# Patient Record
Sex: Male | Born: 1980 | Race: White | Hispanic: No | Marital: Single | Smoking: Never smoker
Health system: Southern US, Community
[De-identification: ages and names within clinical notes are randomized; demographics above are authoritative.]

## PROBLEM LIST (undated history)

## (undated) HISTORY — PX: OTHER SURGICAL HISTORY: SHX169

---

## 2012-07-02 ENCOUNTER — Ambulatory Visit (INDEPENDENT_AMBULATORY_CARE_PROVIDER_SITE_OTHER): Payer: BC Managed Care – PPO | Admitting: Urology

## 2012-07-02 DIAGNOSIS — N500 Atrophy of testis: Secondary | ICD-10-CM

## 2012-07-02 DIAGNOSIS — N469 Male infertility, unspecified: Secondary | ICD-10-CM

## 2013-11-11 ENCOUNTER — Encounter: Payer: Self-pay | Admitting: *Deleted

## 2014-03-19 ENCOUNTER — Ambulatory Visit (INDEPENDENT_AMBULATORY_CARE_PROVIDER_SITE_OTHER): Payer: Self-pay | Admitting: Physician Assistant

## 2014-03-19 VITALS — BP 122/68 | HR 85 | Temp 98.6°F | Resp 17 | Ht 67.0 in | Wt 200.0 lb

## 2014-03-19 DIAGNOSIS — Z029 Encounter for administrative examinations, unspecified: Secondary | ICD-10-CM

## 2014-03-19 DIAGNOSIS — Z024 Encounter for examination for driving license: Secondary | ICD-10-CM

## 2014-03-19 NOTE — Progress Notes (Signed)
This patient presents for DOT examination for fitness for duty.  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  Medical History: no  Any illness or injury in the last 5 years? yes  Head/Brain Injuries, disorders or illnesses no  Seizures, epilepsy yes  Eye disorders or impaired vision (except corrective lenses) - wears glasses no  Ear disorders, loss of hearing or balance no Heart disease or heart attack; other cardiovascular condition no  Heart surgery (valve replacement/bypass, angioplasty, pacemaker) no  High blood pressure no  Muscular disease no  Shortness of breath no  Lung disease, emphysema, asthma, chronic bronchitis no  Kidney disease, dialysis no  Liver disease no  Digestive problems no  Diabetes or elevated blood sugar no  Nervious or psychiatric disorders, e.g., severe depression no  Loss of, or altered consciousness no  Fainting, dizziness no  Sleep disorders, pauses in breathing while asleep, daytime sleepiness, loud snoring no  Stroke or paralysis no  Missing or impaired hand, arm, foot, leg, finger, toe no  Spinal injury or disease no  Chronic low back pain no  Regular, frequent alcohol use no  Narcotic or habit forming drug use  Current Medications: Prior to Admission medications   Not on File    Primary Care Provider: No PCP Per Patient Specialists:   Medical Examiner's Comments on Health History:  Healthy - has a current cold  TESTING:   Visual Acuity Screening   Right eye Left eye Both eyes  Without correction:     With correction: 20/20 20/20 20/20   Comments: Peripheral Vision: Right eye 85 degrees. Left eye 85 degrees. The patient can distinguish the colors red, amber and green.   Monocular Vision: No.  Hearing Aid used for test: No. Hearing Aid required to to meet standard: No. Distance from individual at which forced whispered voice can first be heard:   RIGHT ear - feet; LEFT ear - feet - pt is not able to hear  the whisper test  If audiometer used, record hearing loss in decibels:  Audiometry unable to be preformed in the office -  BP 122/68 mmHg  Pulse 85  Temp(Src) 98.6 F (37 C) (Oral)  Resp 17  Ht 5\' 7"  (1.702 m)  Wt 200 lb (90.719 kg)  BMI 31.32 kg/m2  SpO2 97% Pulse rate is regular BP 122/68  Urine Specimen: Specific Gravity 1.020, Protein neg, Blood neg, Sugar neg  Other Testing:   PHYSICAL EXAMINATION:  1. No. General Appearance 2. No. Eyes   3. No. Ears     4. No. Mouth and Throat    5. No. Heart     6. No. Lungs and Chest, not including breast examination  7. No. Abdomen and Viscera   8. No. Vascular System    9. No. Genitourinary System   10. No. Extremities-Limb impaired.  11. No. Spine, other musculoskeletal  12. No. Neurological     Comments: Healthy - wears glasses  Certification Status: does not meet standards for 2 year certificate. Does not meet standards. Meets standards, but periodic monitoring required due to: hearing problems  Driver qualified only for: 3 months   Return to medical examiner's office for follow-up on before 3 months  Wearing corrective lenses: yes Wearing hearing aid: no Accompanied by no waiver/exemption Skill performance Evaluation (SPE) Certificate: no Driving within an exempt intracity zone: no Qualified by operation of 49 CFR 161.09391.64: yes  Certification expires 06/17/2013

## 2016-09-15 DIAGNOSIS — S81812A Laceration without foreign body, left lower leg, initial encounter: Secondary | ICD-10-CM | POA: Diagnosis not present

## 2018-05-07 DIAGNOSIS — R05 Cough: Secondary | ICD-10-CM | POA: Diagnosis not present

## 2018-05-07 DIAGNOSIS — J019 Acute sinusitis, unspecified: Secondary | ICD-10-CM | POA: Diagnosis not present

## 2018-05-07 DIAGNOSIS — Z6832 Body mass index (BMI) 32.0-32.9, adult: Secondary | ICD-10-CM | POA: Diagnosis not present

## 2018-09-12 ENCOUNTER — Other Ambulatory Visit: Payer: Self-pay

## 2018-09-12 ENCOUNTER — Emergency Department (HOSPITAL_COMMUNITY): Payer: BC Managed Care – PPO

## 2018-09-12 ENCOUNTER — Emergency Department (HOSPITAL_COMMUNITY)
Admission: EM | Admit: 2018-09-12 | Discharge: 2018-09-12 | Disposition: A | Discharge status: Home / Self Care | Payer: BC Managed Care – PPO | Attending: Emergency Medicine | Admitting: Emergency Medicine

## 2018-09-12 ENCOUNTER — Encounter (HOSPITAL_COMMUNITY): Payer: Self-pay | Admitting: Emergency Medicine

## 2018-09-12 DIAGNOSIS — Y9389 Activity, other specified: Secondary | ICD-10-CM | POA: Diagnosis not present

## 2018-09-12 DIAGNOSIS — R079 Chest pain, unspecified: Secondary | ICD-10-CM | POA: Diagnosis not present

## 2018-09-12 DIAGNOSIS — R51 Headache: Secondary | ICD-10-CM | POA: Insufficient documentation

## 2018-09-12 DIAGNOSIS — Y999 Unspecified external cause status: Secondary | ICD-10-CM | POA: Insufficient documentation

## 2018-09-12 DIAGNOSIS — S50312A Abrasion of left elbow, initial encounter: Secondary | ICD-10-CM | POA: Diagnosis not present

## 2018-09-12 DIAGNOSIS — Y92410 Unspecified street and highway as the place of occurrence of the external cause: Secondary | ICD-10-CM | POA: Diagnosis not present

## 2018-09-12 DIAGNOSIS — R109 Unspecified abdominal pain: Secondary | ICD-10-CM | POA: Diagnosis not present

## 2018-09-12 DIAGNOSIS — S0181XA Laceration without foreign body of other part of head, initial encounter: Secondary | ICD-10-CM | POA: Insufficient documentation

## 2018-09-12 DIAGNOSIS — S80211A Abrasion, right knee, initial encounter: Secondary | ICD-10-CM | POA: Insufficient documentation

## 2018-09-12 LAB — COMPREHENSIVE METABOLIC PANEL
ALT: 22 U/L (ref 0–44)
AST: 22 U/L (ref 15–41)
Albumin: 4.3 g/dL (ref 3.5–5.0)
Alkaline Phosphatase: 51 U/L (ref 38–126)
Anion gap: 11 (ref 5–15)
BUN: 23 mg/dL — ABNORMAL HIGH (ref 6–20)
CO2: 24 mmol/L (ref 22–32)
Calcium: 9.3 mg/dL (ref 8.9–10.3)
Chloride: 102 mmol/L (ref 98–111)
Creatinine, Ser: 1.04 mg/dL (ref 0.61–1.24)
GFR calc Af Amer: >60 mL/min (ref >60)
GFR calc non Af Amer: >60 mL/min (ref >60)
Glucose, Bld: 138 mg/dL — ABNORMAL HIGH (ref 70–99)
Potassium: 3.5 mmol/L (ref 3.5–5.1)
Sodium: 137 mmol/L (ref 135–145)
Total Bilirubin: 1.1 mg/dL (ref 0.3–1.2)
Total Protein: 7.2 g/dL (ref 6.5–8.1)

## 2018-09-12 LAB — CBC WITH DIFFERENTIAL/PLATELET
Abs Immature Granulocytes: 0.04 10*3/uL (ref 0.00–0.07)
Basophils Absolute: 0.1 10*3/uL (ref 0.0–0.1)
Basophils Relative: 0 %
Eosinophils Absolute: 0 10*3/uL (ref 0.0–0.5)
Eosinophils Relative: 0 %
HCT: 48.3 % (ref 39.0–52.0)
Hemoglobin: 15.8 g/dL (ref 13.0–17.0)
Immature Granulocytes: 0 %
Lymphocytes Relative: 7 %
Lymphs Abs: 0.8 10*3/uL (ref 0.7–4.0)
MCH: 28.7 pg (ref 26.0–34.0)
MCHC: 32.7 g/dL (ref 30.0–36.0)
MCV: 87.7 fL (ref 80.0–100.0)
Monocytes Absolute: 1.1 10*3/uL — ABNORMAL HIGH (ref 0.1–1.0)
Monocytes Relative: 9 %
Neutro Abs: 9.6 10*3/uL — ABNORMAL HIGH (ref 1.7–7.7)
Neutrophils Relative %: 84 %
Platelets: 221 10*3/uL (ref 150–400)
RBC: 5.51 MIL/uL (ref 4.22–5.81)
RDW: 13.4 % (ref 11.5–15.5)
WBC: 11.6 10*3/uL — ABNORMAL HIGH (ref 4.0–10.5)
nRBC: 0 % (ref 0.0–0.2)

## 2018-09-12 LAB — ETHANOL: Alcohol, Ethyl (B): <10 mg/dL (ref <10)
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
## 2018-09-12 MED ORDER — HYDROMORPHONE HCL 1 MG/ML IJ SOLN
0.5000 mg | Freq: Once | INTRAMUSCULAR | Status: AC
  Administered 2018-09-12: 0.5 mg via INTRAVENOUS
  Filled 2018-09-12: qty 1

## 2018-09-12 MED ORDER — IBUPROFEN 800 MG PO TABS: 800.0000 mg | ORAL_TABLET | Freq: Three times a day (TID) | ORAL | 0 refills | Status: DC

## 2018-09-12 MED ORDER — IOHEXOL 300 MG/ML  SOLN
100.0000 mL | Freq: Once | INTRAMUSCULAR | Status: AC | PRN
  Administered 2018-09-12: 100 mL via INTRAVENOUS

## 2018-09-12 MED ORDER — LIDOCAINE HCL (PF) 1 % IJ SOLN
INTRAMUSCULAR | Status: AC
  Filled 2018-09-12: qty 10

## 2018-09-12 MED ORDER — SODIUM CHLORIDE 0.9 % IV BOLUS
1000.0000 mL | Freq: Once | INTRAVENOUS | Status: AC
  Administered 2018-09-12: 1000 mL via INTRAVENOUS

## 2018-09-12 MED ORDER — LIDOCAINE HCL (PF) 1 % IJ SOLN
10.0000 mL | Freq: Once | INTRAMUSCULAR | Status: AC
  Administered 2018-09-12: 10 mL via INTRADERMAL

## 2018-09-12 MED ORDER — BACITRACIN ZINC 500 UNIT/GM EX OINT
TOPICAL_OINTMENT | CUTANEOUS | Status: AC
  Administered 2018-09-12: 12:00:00
  Filled 2018-09-12: qty 9

## 2018-09-12 MED ORDER — OXYCODONE-ACETAMINOPHEN 5-325 MG PO TABS: 1.0000 | ORAL_TABLET | ORAL | 0 refills | Status: DC | PRN

## 2018-09-12 MED ORDER — POVIDONE-IODINE 10 % EX SOLN
CUTANEOUS | Status: AC
  Administered 2018-09-12: 11:00:00
  Filled 2018-09-12: qty 15

## 2018-09-12 NOTE — ED Notes (Signed)
Cleansed wounds with sur clens. Placed bacitracin on abrasions and covered with telfa and medipore tape. Xeroform placed on RT knee. RT knee wrapped with ace bandage for additional support. Pt given crutches.
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
## 2018-09-12 NOTE — Discharge Instructions (Addendum)
Keep the wounds clean and bandaged.  You may apply ice packs on/off to help reduce swelling.  Crutches as needed.  Sutures out in 5-7 days.  Follow-up with your primary doctor for recheck.  Return here for any signs of infection.

## 2019-03-26 ENCOUNTER — Encounter: Payer: Self-pay | Admitting: Gastroenterology

## 2019-04-14 ENCOUNTER — Other Ambulatory Visit: Payer: Self-pay

## 2019-04-14 ENCOUNTER — Ambulatory Visit (INDEPENDENT_AMBULATORY_CARE_PROVIDER_SITE_OTHER): Payer: Self-pay | Admitting: *Deleted

## 2019-04-14 DIAGNOSIS — Z1211 Encounter for screening for malignant neoplasm of colon: Secondary | ICD-10-CM

## 2019-04-14 MED ORDER — PEG 3350-KCL-NA BICARB-NACL 420 G PO SOLR: 4000.0000 mL | Freq: Once | ORAL | 0 refills | Status: AC

## 2019-04-14 NOTE — Patient Instructions (Signed)
Randall Butler   1981/03/23 MRN: [id number]    Procedure Date: 07/28/2019 Time to register: 9:30 am Place to register: [facility] Procedure Time: 10:30 am Scheduled provider: Dr. Oneida Alar  PREPARATION FOR COLONOSCOPY WITH TRI-LYTE SPLIT PREP  Please notify us immediately if you are diabetic, take iron supplements, or if you are on Coumadin or any other blood thinners.   Please hold the following medications: n/a  You will need to purchase 1 fleet enema and 1 box of Bisacodyl '5mg'$  tablets.   1 DAY BEFORE PROCEDURE:  DATE: 07/27/2019  DAY: Sunday Continue clear liquids the entire day - NO SOLID FOOD.   Diabetic medications adjustments for today: n/a  At 2:00 pm:  Take 2 Bisacodyl tablets.   At 4:00pm:  Start drinking your solution. Make sure you mix well per instructions on the bottle. Try to drink 1 (one) 8 ounce glass every 10-15 minutes until you have consumed HALF the jug. You should complete by 6:00pm.You must keep the left over solution refrigerated until completed next day.  Continue clear liquids. You must drink plenty of clear liquids to prevent dehyration and kidney failure.     DAY OF PROCEDURE:   DATE: 07/28/2019   DAY: Monday If you take medications for your heart, blood pressure or breathing, you may take these medications.  Diabetic medications adjustments for today: n/a  Five hours before your procedure time @  5:30 am:  Finish remaining amout of bowel prep, drinking 1 (one) 8 ounce glass every 10-15 minutes until complete. You have two hours to consume remaining prep.   Three hours before your procedure time @ 7:30 am:  Nothing by mouth.   At least one hour before going to the hospital:  Give yourself one Fleet enema. You may take your morning medications with sip of water unless we have instructed otherwise.      Please see below for Dietary Information.  CLEAR LIQUIDS INCLUDE:  Water Jello (NOT red in color)   Ice Popsicles (NOT red in color)    Tea (sugar ok, no milk/cream) Powdered fruit flavored drinks  Coffee (sugar ok, no milk/cream) Gatorade/ Lemonade/ Kool-Aid  (NOT red in color)   Juice: apple, white grape, white cranberry Soft drinks  Clear bullion, consomme, broth (fat free beef/chicken/vegetable)  Carbonated beverages (any kind)  Strained chicken noodle soup Hard Candy   Remember: Clear liquids are liquids that will allow you to see your fingers on the other side of a clear glass. Be sure liquids are NOT red in color, and not cloudy, but CLEAR.  DO NOT EAT OR DRINK ANY OF THE FOLLOWING:  Dairy products of any kind   Cranberry juice Tomato juice / V8 juice   Grapefruit juice Orange juice     Red grape juice  Do not eat any solid foods, including such foods as: cereal, oatmeal, yogurt, fruits, vegetables, creamed soups, eggs, bread, crackers, pureed foods in a blender, etc.   HELPFUL HINTS FOR DRINKING PREP SOLUTION:   Make sure prep is extremely cold. Mix and refrigerate the the morning of the prep. You may also put in the freezer.   You may try mixing some Crystal Light or Country Time Lemonade if you prefer. Mix in small amounts; add more if necessary.  Try drinking through a straw  Rinse mouth with water or a mouthwash between glasses, to remove after-taste.  Try sipping on a cold beverage /ice/ popsicles between glasses of prep.  Place a piece of sugar-free  hard candy in mouth between glasses.  If you become nauseated, try consuming smaller amounts, or stretch out the time between glasses. Stop for 30-60 minutes, then slowly start back drinking.        OTHER INSTRUCTIONS  You will need a responsible adult at least 40 years of age to accompany you and drive you home. This person must remain in the waiting room during your procedure. The hospital will cancel your procedure if you do not have a responsible adult with you.   1. Wear loose fitting clothing that is easily removed. 2. Leave jewelry and  other valuables at home.  3. Remove all body piercing jewelry and leave at home. 4. Total time from sign-in until discharge is approximately 2-3 hours. 5. You should go home directly after your procedure and rest. You can resume normal activities the day after your procedure. 6. The day of your procedure you should not:  Drive  Make legal decisions  Operate machinery  Drink alcohol  Return to work   You may call the office (Dept: [phone]) before 5:00pm, or page the doctor on call [phone]) after 5:00pm, for further instructions, if necessary.   Insurance Information YOU WILL NEED TO CHECK WITH YOUR INSURANCE COMPANY FOR THE BENEFITS OF COVERAGE YOU HAVE FOR THIS PROCEDURE.  UNFORTUNATELY, NOT ALL INSURANCE COMPANIES HAVE BENEFITS TO COVER ALL OR PART OF THESE TYPES OF PROCEDURES.  IT IS YOUR RESPONSIBILITY TO CHECK YOUR BENEFITS, HOWEVER, WE WILL BE GLAD TO ASSIST YOU WITH ANY CODES YOUR INSURANCE COMPANY MAY NEED.    PLEASE NOTE THAT MOST INSURANCE COMPANIES WILL NOT COVER A SCREENING COLONOSCOPY FOR PEOPLE UNDER THE AGE OF 50  IF YOU HAVE BCBS INSURANCE, YOU MAY HAVE BENEFITS FOR A SCREENING COLONOSCOPY BUT IF POLYPS ARE FOUND THE DIAGNOSIS WILL CHANGE AND THEN YOU MAY HAVE A DEDUCTIBLE THAT WILL NEED TO BE MET. SO PLEASE MAKE SURE YOU CHECK YOUR BENEFITS FOR A SCREENING COLONOSCOPY AS WELL AS A DIAGNOSTIC COLONOSCOPY.

## 2019-04-14 NOTE — Progress Notes (Addendum)
Gastroenterology Pre-Procedure Review  Request Date: 04/14/2019 Requesting Physician: Lianne Moris, PA-C @ [facility], no previous TCS, family hx tubular adenomas  PATIENT REVIEW QUESTIONS: The patient responded to the following health history questions as indicated:    1. Diabetes Melitis: no 2. Joint replacements in the past 12 months: no 3. Major health problems in the past 3 months: no 4. Has an artificial valve or MVP: no 5. Has a defibrillator: no 6. Has been advised in past to take antibiotics in advance of a procedure like teeth cleaning: no 7. Family history of colon cancer: no  8. Alcohol Use: yes, a couple beers a month 9. Illicit drug Use: no 10. History of sleep apnea: no 11. History of coronary artery or other vascular stents placed within the last 12 months: no 12. History of any prior anesthesia complications: no 13. There is no height or weight on file to calculate BMI.ht: 5'6 wt: 193 lbs    MEDICATIONS & ALLERGIES:    Patient reports the following regarding taking any blood thinners:   Plavix? no Aspirin? no Coumadin? no Brilinta? no Xarelto? no Eliquis? no Pradaxa? no Savaysa? no Effient? no  Patient confirms/reports the following medications:  No current outpatient medications on file.   No current facility-administered medications for this visit.    Patient confirms/reports the following allergies:  Allergies  Allergen Reactions  . Penicillins Other (See Comments)    Per pt. His mother has always told him that he was allergic to PCN , but he is not sure of what type of reaction he had     No orders of the defined types were placed in this encounter.   AUTHORIZATION INFORMATION Primary Insurance: [place],  [place] #: [id number],  Group #: [id number] Pre-Cert / Berkley Harvey required: No, not required  SCHEDULE INFORMATION: Procedure has been scheduled as follows:  Date: 07/28/2019, Time: 10:30 Location: [facility] with Dr. Darrick Penna  This Gastroenterology  Pre-Precedure Review Form is being routed to the following provider(s): Wynne Dust, NP

## 2019-04-15 ENCOUNTER — Telehealth: Payer: Self-pay | Admitting: *Deleted

## 2019-04-15 NOTE — Addendum Note (Signed)
Addended by: Noreene Larsson on: 04/15/2019 09:30 AM   Modules accepted: Orders, SmartSet

## 2019-04-15 NOTE — Telephone Encounter (Signed)
Noted  

## 2019-04-15 NOTE — Telephone Encounter (Signed)
[employer] and spoke to rep Ronalee Belts).  Informed him that pt is 39 yrs old and has a family hx of tubular adenomas.  He was recommended by [facility] to have his TCS at 39 yrs old.  Ronalee Belts said that no Josem Kaufmann would be required and pt has a $3500 in network deductible of which $450 has been met.  He also said that pt has a 20% coinsurance of $6350.00. Ref# [id number]. Called pt and gave him this information.  Pt decided to cancel his procedure and Covid screening due to the expenses that would be required of him.  [facility] to notify them.

## 2019-04-15 NOTE — Progress Notes (Signed)
Ok to schedule.

## 2019-07-25 ENCOUNTER — Other Ambulatory Visit (HOSPITAL_COMMUNITY): Payer: BC Managed Care – PPO

## 2019-07-28 ENCOUNTER — Encounter (HOSPITAL_COMMUNITY): Payer: Self-pay

## 2019-07-28 ENCOUNTER — Ambulatory Visit (HOSPITAL_COMMUNITY): Admit: 2019-07-28 | Payer: BC Managed Care – PPO | Admitting: Gastroenterology

## 2019-07-28 SURGERY — COLONOSCOPY
Anesthesia: Moderate Sedation

## 2020-03-03 ENCOUNTER — Other Ambulatory Visit: Payer: Self-pay | Admitting: Physician Assistant

## 2020-03-03 DIAGNOSIS — G959 Disease of spinal cord, unspecified: Secondary | ICD-10-CM

## 2020-03-09 ENCOUNTER — Other Ambulatory Visit: Payer: Self-pay

## 2020-03-09 ENCOUNTER — Ambulatory Visit
Admission: RE | Admit: 2020-03-09 | Discharge: 2020-03-09 | Disposition: A | Discharge status: Home / Self Care | Payer: BC Managed Care – PPO | Source: Ambulatory Visit | Attending: Physician Assistant | Admitting: Physician Assistant

## 2020-03-09 DIAGNOSIS — G959 Disease of spinal cord, unspecified: Secondary | ICD-10-CM

## 2020-03-09 IMAGING — MR MR LUMBAR SPINE W/O CM
4 of 5 series · 27 of 48 positions shown · non-contrast
Comparison: None.

CLINICAL DATA: Numbness.  Low back pain.

EXAM:
MRI LUMBAR SPINE WITHOUT CONTRAST
TECHNIQUE: Multiplanar, multisequence MR imaging of the lumbar spine was
performed. No intravenous contrast was administered.

[Series 3: T2 · sagittal · 4.0mm · 1.09mm/px · 6 of 15 slices shown (1 of 2)]
[im 1/15]
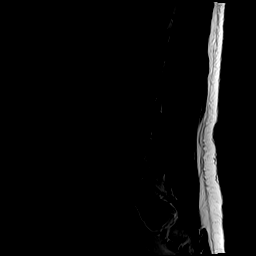
[im 3/15]
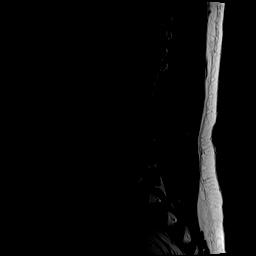
[im 6/15]
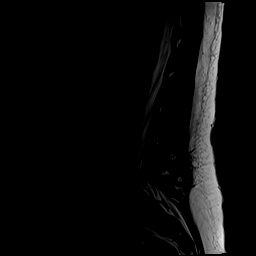
[im 9/15]
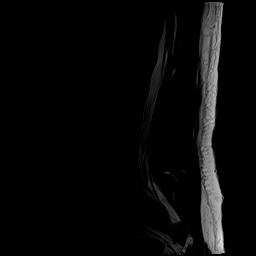
[im 12/15]
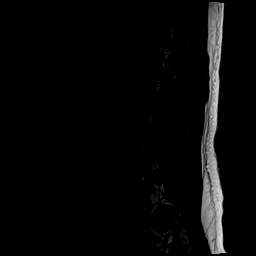
[im 15/15]
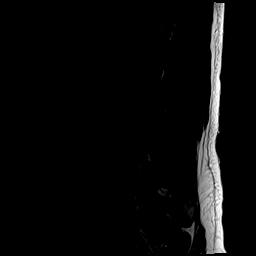

[Series 5: T1 · sagittal · 4.0mm · 1.09mm/px · 6 of 15 slices shown (1 of 2)]
[im 1/15]
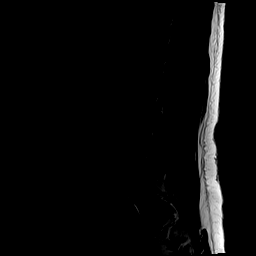
[im 3/15]
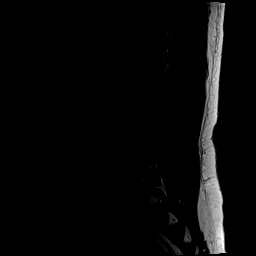
[im 6/15]
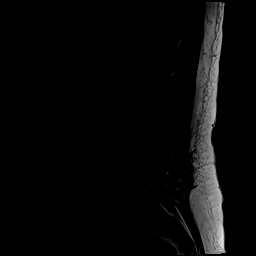
[im 9/15]
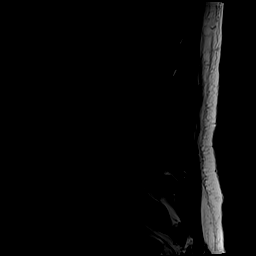
[im 12/15]
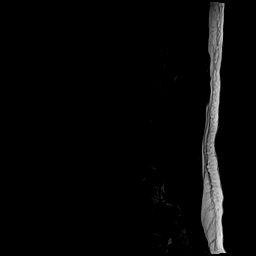
[im 15/15]
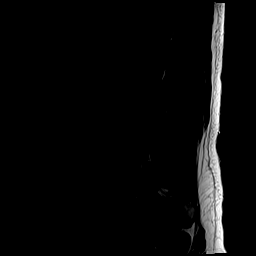

[Series 6: T2 · axial · 4.0mm · 0.39mm/px · z∈[-169,+60]mm · 9 of 40 slices shown (2 of 2)]
[im 1/40]
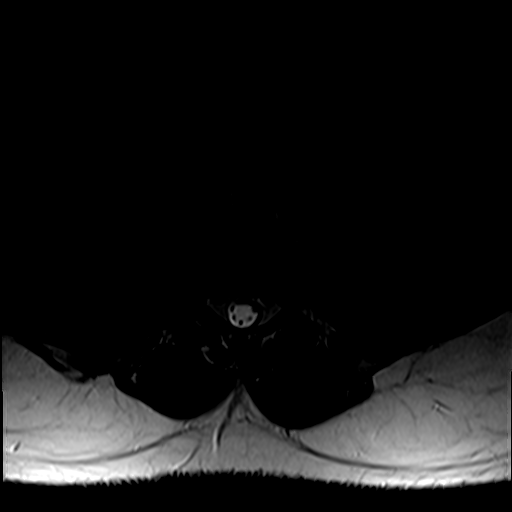
[im 6/40]
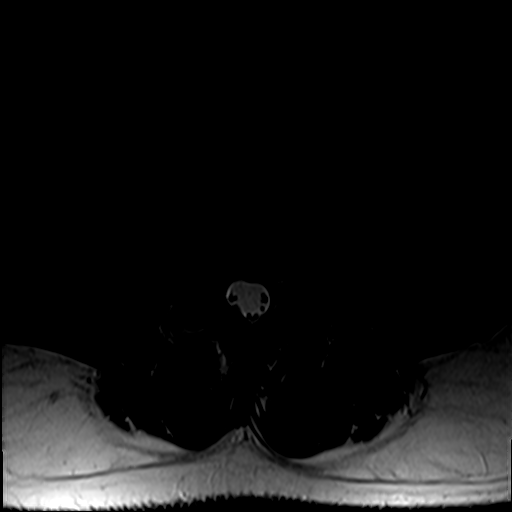
[im 12/40]
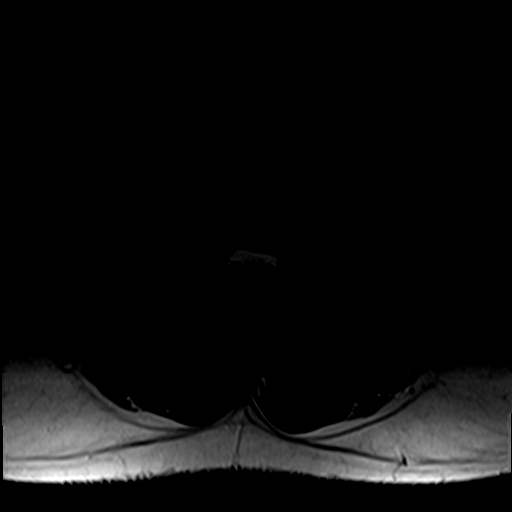
[im 17/40]
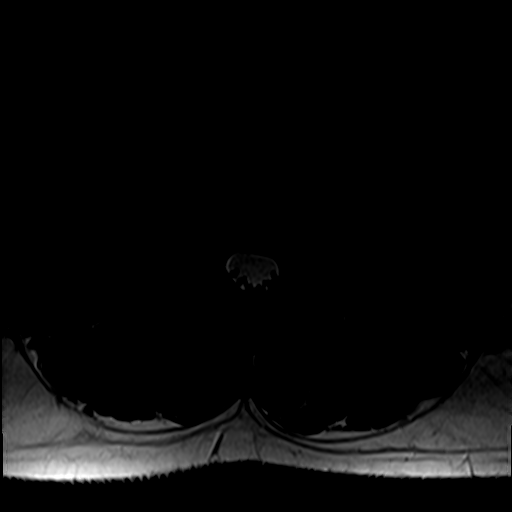
[im 20/40]
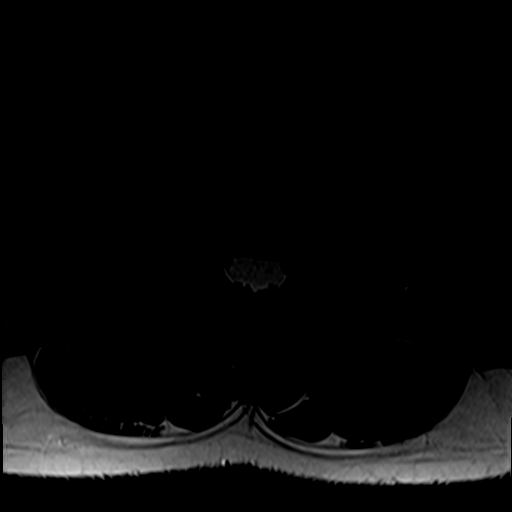
[im 23/40]
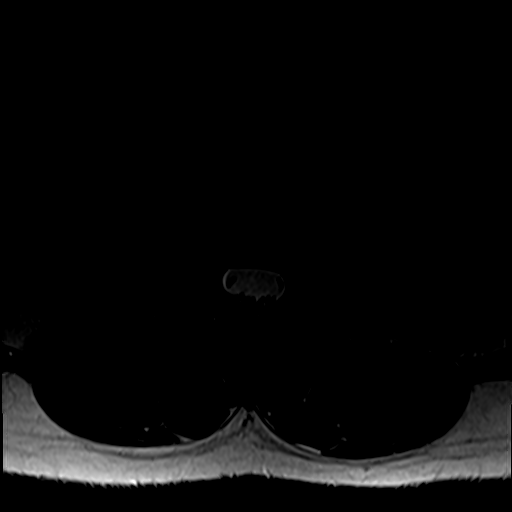
[im 28/40]
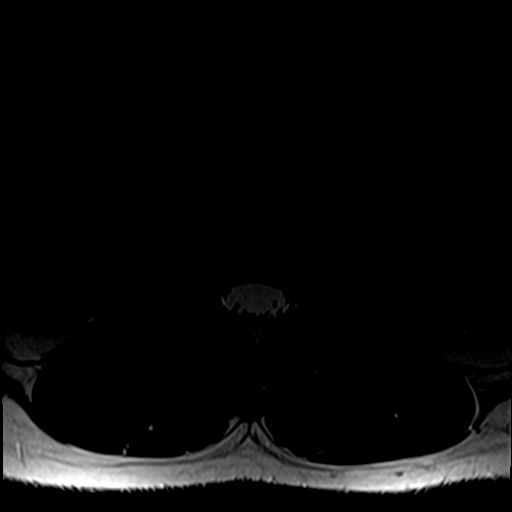
[im 34/40]
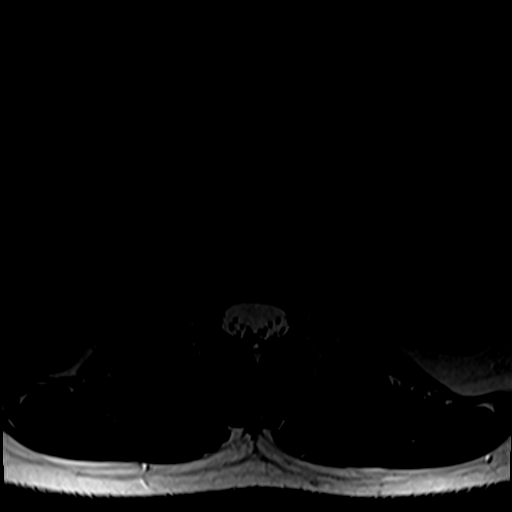
[im 40/40]
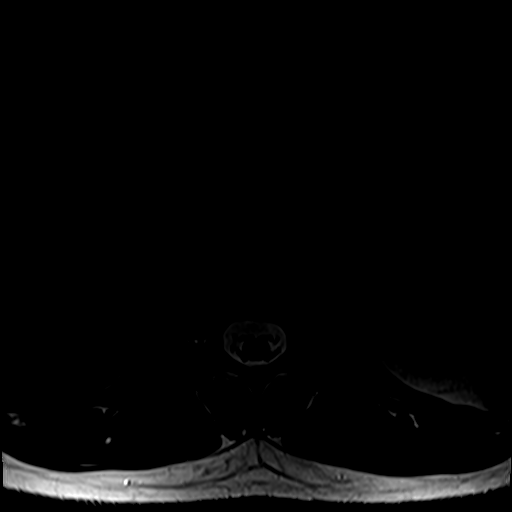

[Series 7: T1 · axial · 4.0mm · 0.39mm/px · z∈[-169,+31]mm · 6 of 40 slices shown (2 of 2)]
[im 1/40]
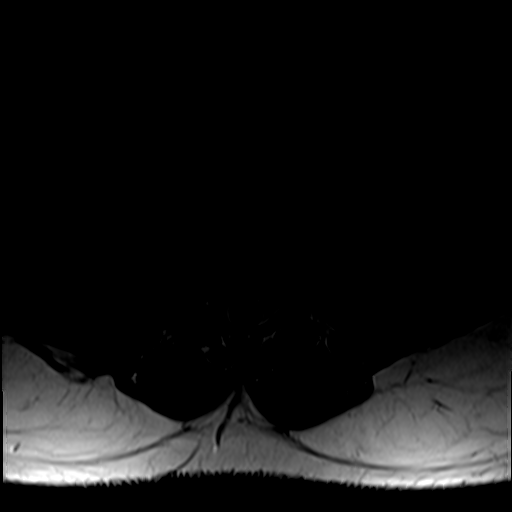
[im 6/40]
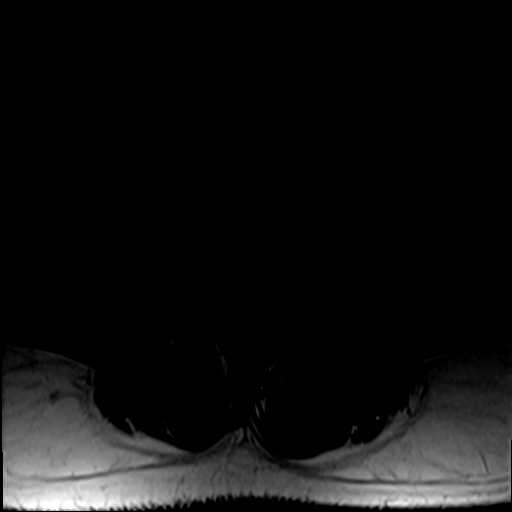
[im 12/40]
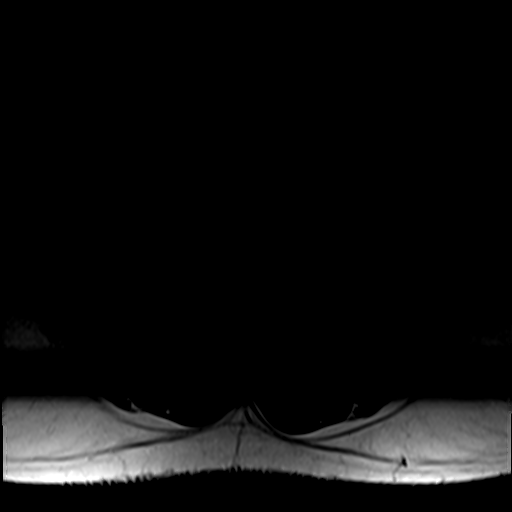
[im 17/40]
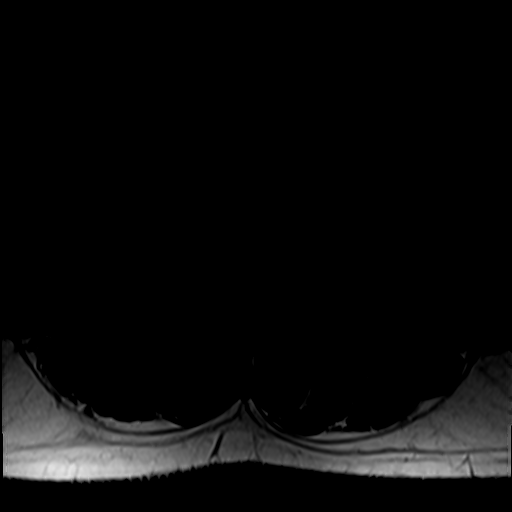
[im 20/40]
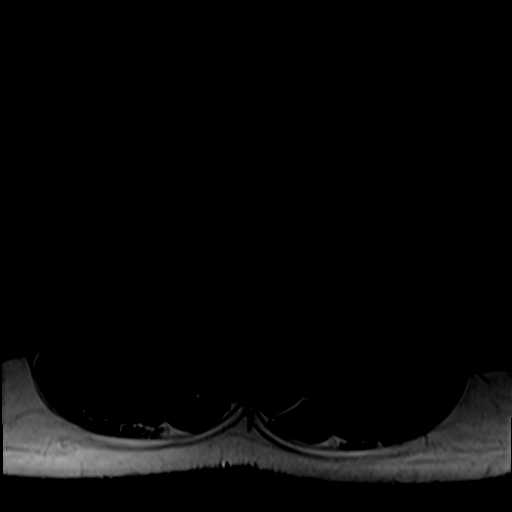
[im 34/40]
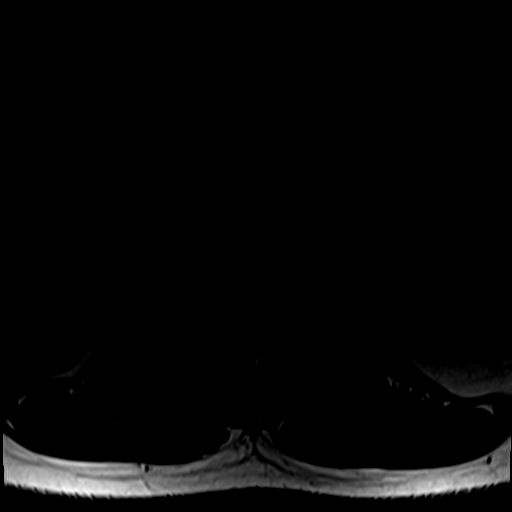

[27 of 48 positions shown; findings below may reference images not displayed]

FINDINGS: Segmentation: Transitional lumbosacral anatomy with lumbarization of
S1.

Alignment:  Physiologic.

Vertebrae: Vertebral body heights are maintained. No focal marrow
replacing process to suggest acute fracture, discitis/osteomyelitis,
or suspicious bone lesion.

Conus medullaris and cauda equina: Conus extends to the L2 level.
Conus and cauda equina appear normal.

Paraspinal and other soft tissues: There appears to be malrotation
of the right kidney, which was described on CT abdomen report from
[DATE] (imaging not available). T2 hyperintense lesion in the
right kidney is suboptimally evaluated on this study, but most
likely represents a cyst.

Disc levels:

T12-L1: Only imaged on sagittal sequences without evidence of
significant canal or foraminal stenosis.

L1-L2: No significant disc protrusion, foraminal stenosis, or canal
stenosis.

L2-L3: No significant disc protrusion, foraminal stenosis, or canal
stenosis.

L3-L4: No significant disc protrusion, foraminal stenosis, or canal
stenosis.

L4-L5: Minimal broad disc bulge without significant canal or
foraminal stenosis.

L5-S1: Disc desiccation and height loss. Broad-based disc bulge
without significant canal or foraminal stenosis.
IMPRESSION: Transitional lumbosacral anatomy with lumbarization of S1.

1. No significant canal or foraminal stenosis.
2. L5-S1 degenerative disc disease.

## 2020-05-24 ENCOUNTER — Other Ambulatory Visit: Payer: Self-pay | Admitting: Neurology

## 2020-05-24 ENCOUNTER — Other Ambulatory Visit (HOSPITAL_COMMUNITY): Payer: Self-pay | Admitting: Neurology

## 2020-05-24 DIAGNOSIS — G35 Multiple sclerosis: Secondary | ICD-10-CM

## 2020-06-04 ENCOUNTER — Encounter (HOSPITAL_COMMUNITY): Payer: Self-pay

## 2020-06-04 ENCOUNTER — Ambulatory Visit (HOSPITAL_COMMUNITY): Admission: RE | Admit: 2020-06-04 | Payer: BC Managed Care – PPO | Source: Ambulatory Visit

## 2020-06-04 ENCOUNTER — Ambulatory Visit (HOSPITAL_COMMUNITY): Payer: BC Managed Care – PPO

## 2020-06-09 ENCOUNTER — Other Ambulatory Visit: Payer: Self-pay | Admitting: Neurology

## 2020-06-09 DIAGNOSIS — G35 Multiple sclerosis: Secondary | ICD-10-CM

## 2020-06-26 ENCOUNTER — Ambulatory Visit
Admission: RE | Admit: 2020-06-26 | Discharge: 2020-06-26 | Disposition: A | Discharge status: Home / Self Care | Payer: BC Managed Care – PPO | Source: Ambulatory Visit | Attending: Neurology | Admitting: Neurology

## 2020-06-26 ENCOUNTER — Other Ambulatory Visit: Payer: Self-pay

## 2020-06-26 DIAGNOSIS — G35 Multiple sclerosis: Secondary | ICD-10-CM

## 2020-06-26 IMAGING — MR MR HEAD WO/W CM
11 series · 42 of 48 positions shown · IV contrast (15cc multihance)
Comparison: Prior head CT from [DATE].

CLINICAL DATA: Initial evaluation for history of numbness to lower
half of body since late [PL]. Evaluate for possible demyelinating
disease.

EXAM:
MRI HEAD WITHOUT AND WITH CONTRAST
MRI CERVICAL SPINE WITHOUT AND WITH CONTRAST
TECHNIQUE: Multiplanar, multiecho pulse sequences of the brain and surrounding
structures, and cervical spine, to include the craniocervical
junction and cervicothoracic junction, were obtained without and
with intravenous contrast.
CONTRAST:  18mL MULTIHANCE GADOBENATE DIMEGLUMINE 529 MG/ML IV SOLN

[Series 3: T1 · sagittal · 5.0mm · 0.45mm/px · 1 of 21 slices shown]
[im 1/21]
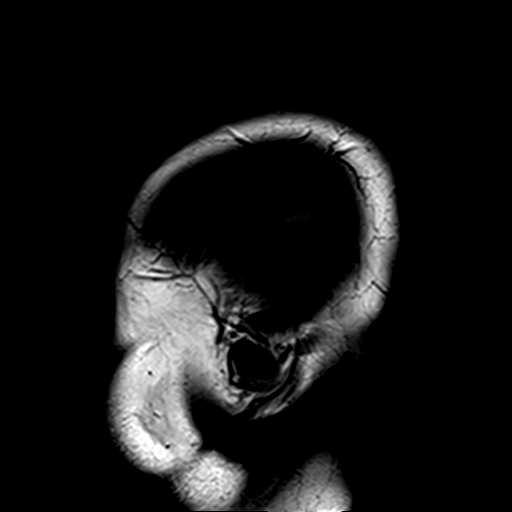

[Series 4: DWI · axial · 3.0mm · 1.80mm/px · z∈[-50,+104]mm · 8 of 99 slices shown (1 of 2)]
[im 1/99]
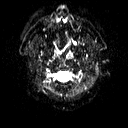
[im 15/99]
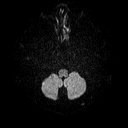
[im 29/99]
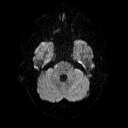
[im 43/99]
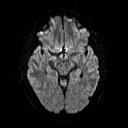
[im 57/99]
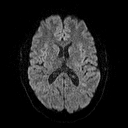
[im 71/99]
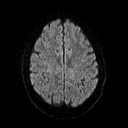
[im 85/99]
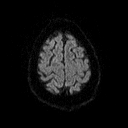
[im 99/99]
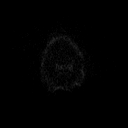

[Series 5: DWI · axial · 3.0mm · 1.80mm/px · z∈[-50,+104]mm · 4 of 50 slices shown (2 of 2)]
[im 1/50]
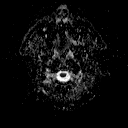
[im 17/50]
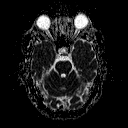
[im 33/50]
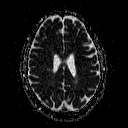
[im 50/50]
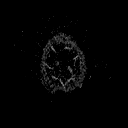

[Series 6: T2 · axial · 5.0mm · 0.60mm/px · z∈[-47,+99]mm · 2 of 22 slices shown (1 of 2)]
[im 1/22]
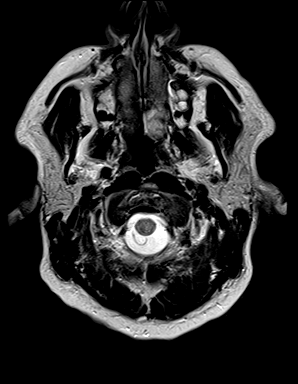
[im 22/22]
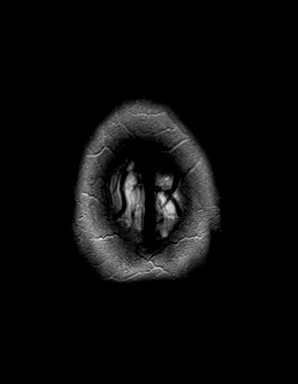

[Series 7: FLAIR · axial · 3.0mm · 0.45mm/px · z∈[-49,+103]mm · 2 of 30 slices shown (1 of 2)]
[im 1/30]
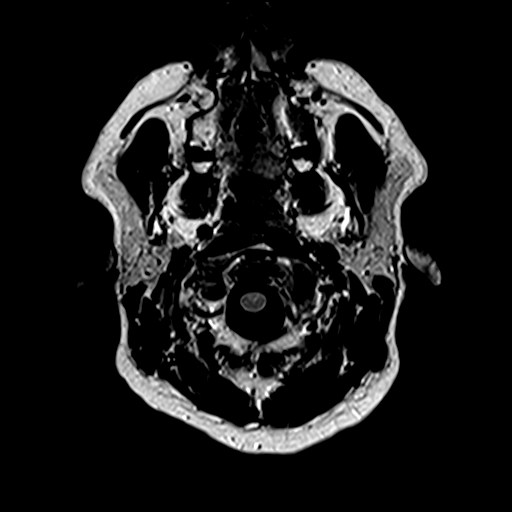
[im 30/30]
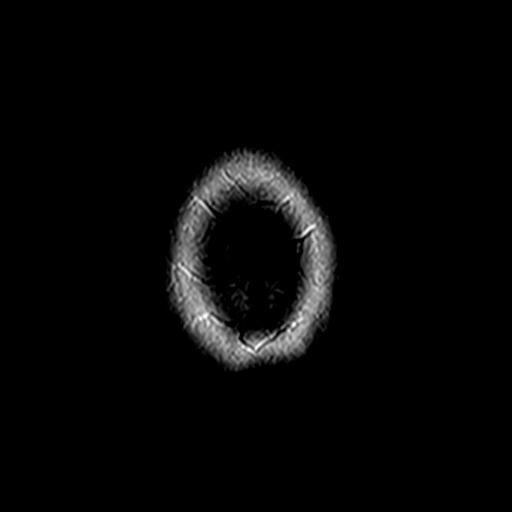

[Series 8: FLAIR · sagittal · 5.0mm · 0.45mm/px · 2 of 25 slices shown (2 of 2)]
[im 1/25]
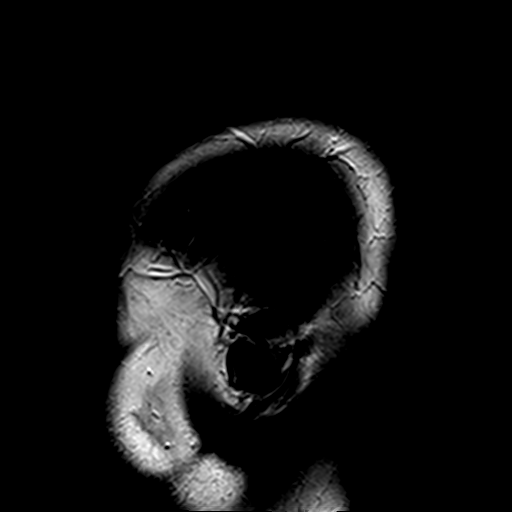
[im 25/25]
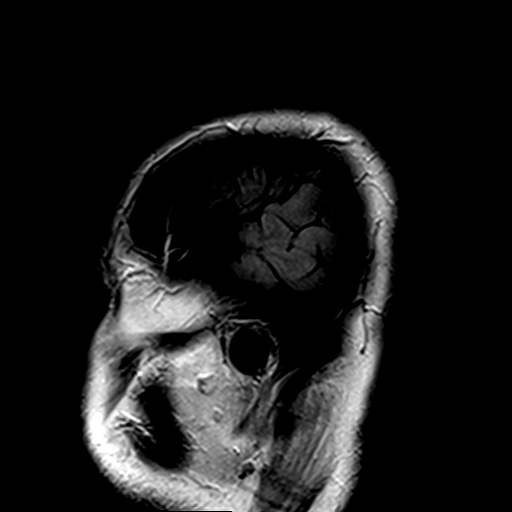

[Series 10: swi_images · axial · 4.0mm · 0.90mm/px · z∈[-43,+97]mm · 3 of 36 slices shown]
[im 1/36]
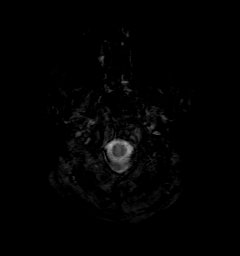
[im 18/36]
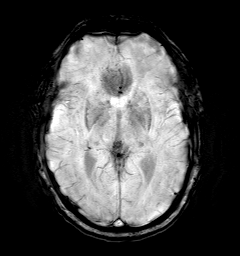
[im 36/36]
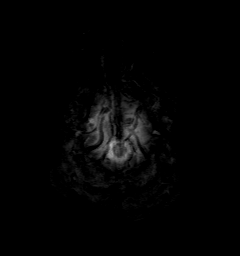

[Series 11: t1_mpr_tra · axial · 1.1mm · 0.75mm/px · z∈[-52,+105]mm · 8 of 144 slices shown]
[im 1/144]
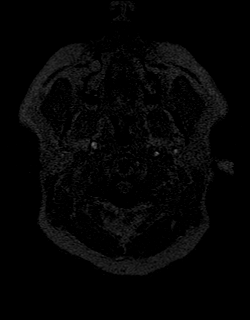
[im 29/144]
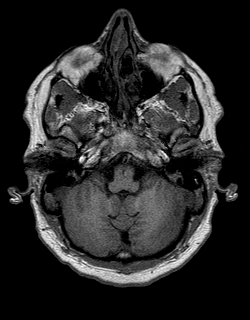
[im 43/144]
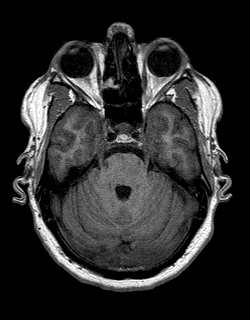
[im 58/144]
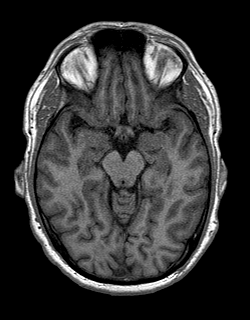
[im 86/144]
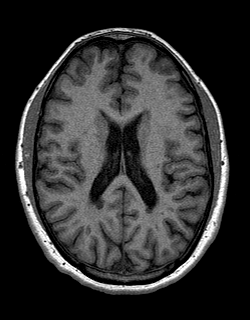
[im 101/144]
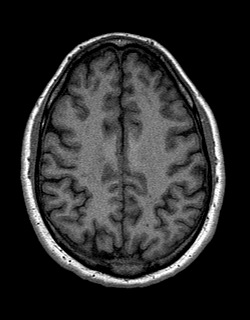
[im 115/144]
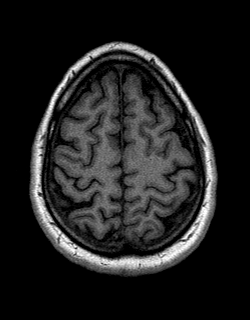
[im 144/144]
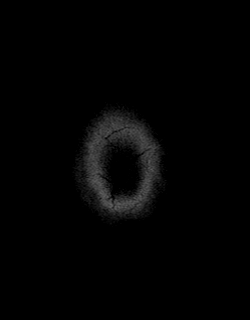

[Series 12: T2 · coronal · 5.0mm · 0.45mm/px · 2 of 25 slices shown (2 of 2)]
[im 1/25]
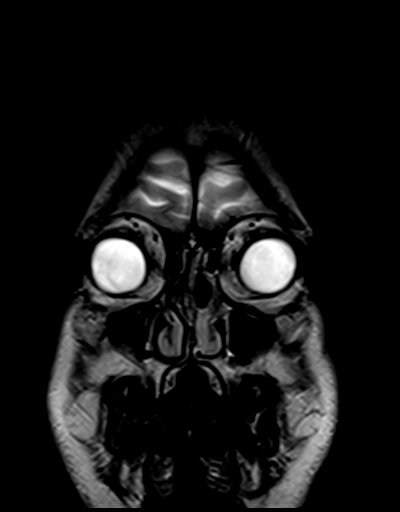
[im 25/25]
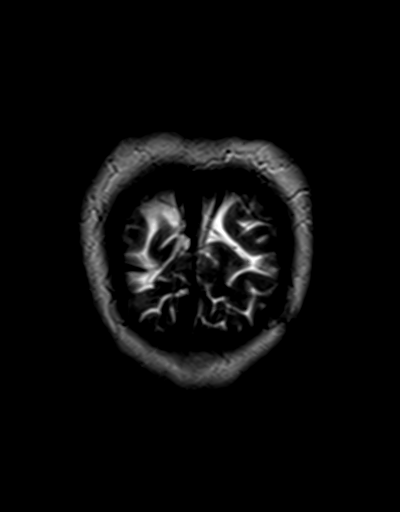

[Series 13: t1_mpr_tra post · axial · 1.1mm · 0.75mm/px · z∈[-52,+105]mm · 8 of 144 slices shown]
[im 1/144]
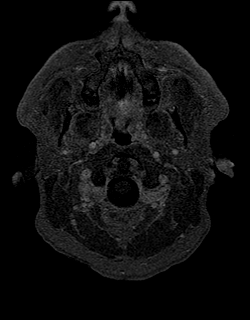
[im 29/144]
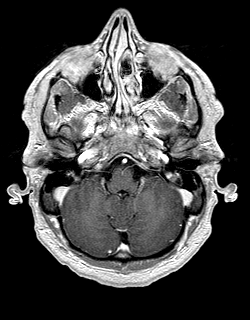
[im 43/144]
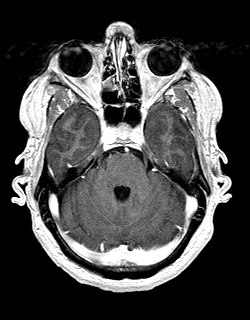
[im 58/144]
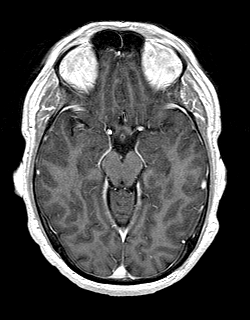
[im 86/144]
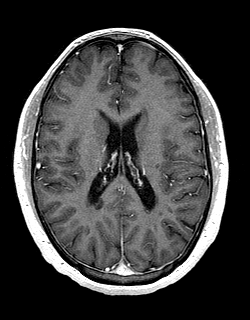
[im 101/144]
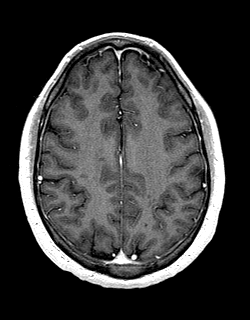
[im 115/144]
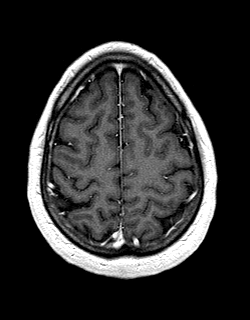
[im 144/144]
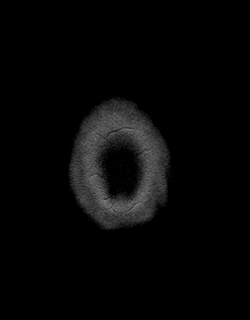

[Series 14: post cor · coronal · 5.0mm · 0.45mm/px · 2 of 25 slices shown]
[im 1/25]
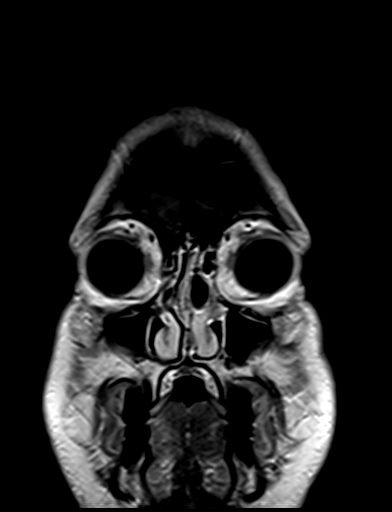
[im 25/25]
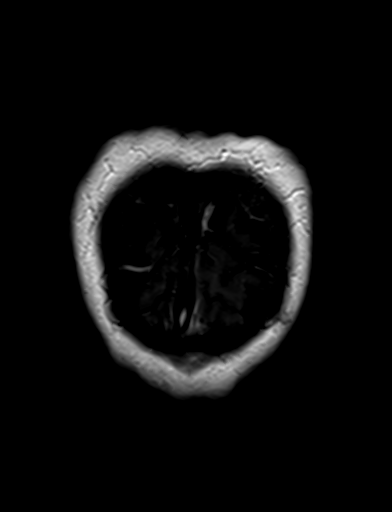

[42 of 48 positions shown; findings below may reference images not displayed]

FINDINGS: MRI HEAD FINDINGS

Brain: Mild diffuse prominence of the CSF containing spaces
compatible with cerebral atrophy, mildly progressed for patient age.
Multiple scattered T2/FLAIR hyperintense lesions are seen involving
the periventricular, deep, and juxta cortical white matter of both
cerebral hemispheres. Several of these foci are oriented
perpendicular to the lateral ventricles with prominent callososeptal
involvement. Infratentorial involvement is seen with a lesion
positioned at the right ventral pons (series 7, image 9). Multiple
corresponding T1 black holes. Findings consistent with demyelinating
disease multiple sclerosis. Subtle patchy enhancement seen about a
juxta cortical lesion at the posterior right parieto-occipital
region, consistent with active demyelination (series 14, image 3).
No other significant evidence for active demyelination identified.

No evidence for acute or subacute infarct. Gray-white matter
differentiation maintained. No encephalomalacia to suggest chronic
cortical infarction. No evidence for acute or chronic intracranial
hemorrhage.

No mass lesion, midline shift or mass effect. No hydrocephalus or
extra-axial fluid collection. Pituitary gland suprasellar region
normal. No other abnormal enhancement.

Vascular: Major intracranial vascular flow voids are well
maintained.

Skull and upper cervical spine: Craniocervical junction normal. Bone
marrow signal intensity normal. No focal marrow replacing lesion. No
scalp soft tissue abnormality.

Sinuses/Orbits: Globes and orbital soft tissues demonstrate no acute
finding. Axial myopia noted. Scattered mucosal thickening noted
throughout the ethmoidal air cells and maxillary sinuses. Paranasal
sinuses are otherwise clear. No mastoid effusion. Inner ear
structures grossly normal.

Other: None.

MRI CERVICAL SPINE FINDINGS

Alignment: Straightening of the normal cervical lordosis. No
listhesis.

Vertebrae: Vertebral body height maintained without acute or chronic
fracture. Bone marrow signal intensity within normal limits. No
discrete or worrisome osseous lesions. No abnormal marrow edema or
enhancement.

Cord: Patchy signal abnormality seen involving the left hemi cord at
the level of C2 (series 7, image 1). Additional subtle focus of
signal abnormality seen at the right dorsal cord at the level of
C4-5 (series 7, image 13). Findings consistent with demyelinating
disease/multiple sclerosis. No other appreciable cord signal
abnormality. No abnormal enhancement to suggest active
demyelination. Overall cord caliber and morphology within normal
limits without significant atrophy.

Posterior Fossa, vertebral arteries, paraspinal tissues:
Craniocervical junction normal. Paraspinous and prevertebral soft
tissues within normal limits. Normal flow voids seen within the
vertebral arteries bilaterally.

Disc levels:

C2-C3: Unremarkable.

C3-C4: Mild annular disc bulge. No significant canal or foraminal
stenosis.

C4-C5:  Minimal annular disc bulge.  No canal or foraminal stenosis.

C5-C6:  Mild annular disc bulge.  No canal or foraminal stenosis.

C6-C7: Mild disc bulge with uncovertebral hypertrophy. No
significant spinal stenosis. Mild left C7 foraminal narrowing. Right
neural foramina remains patent.

C7-T1: Normal interspace. Mild left-sided facet hypertrophy. No
stenosis.

Visualized upper thoracic spine demonstrates no significant finding.
IMPRESSION: MRI HEAD IMPRESSION:

1. Patchy multifocal T2/FLAIR hyperintense lesions involving the
supratentorial and infratentorial cerebral white matter as above,
consistent with demyelinating disease/multiple sclerosis. Associated
mild patchy enhancement about a juxta cortical lesion at the
posterior right parieto-occipital region, consistent with active
demyelination.
2. No other acute intracranial abnormality.

MRI CERVICAL SPINE IMPRESSION:

1. Patchy cord signal abnormality involving the left hemi cord at
the level of C2 and right dorsal cord at C4-5, consistent with
demyelinating disease/multiple sclerosis. No evidence for active
demyelination.
2. Mild noncompressive disc bulging at C3-4 through C6-7 without
significant spinal stenosis. Mild left C7 foraminal narrowing.

## 2020-06-26 IMAGING — MR MR CERVICAL SPINE WO/W CM
8 series · 48 of 48 positions shown · IV contrast (15cc multihance)
Comparison: Prior head CT from [DATE].

CLINICAL DATA: Initial evaluation for history of numbness to lower
half of body since late [PL]. Evaluate for possible demyelinating
disease.

EXAM:
MRI HEAD WITHOUT AND WITH CONTRAST
MRI CERVICAL SPINE WITHOUT AND WITH CONTRAST
TECHNIQUE: Multiplanar, multiecho pulse sequences of the brain and surrounding
structures, and cervical spine, to include the craniocervical
junction and cervicothoracic junction, were obtained without and
with intravenous contrast.
CONTRAST:  18mL MULTIHANCE GADOBENATE DIMEGLUMINE 529 MG/ML IV SOLN

[Series 4: T1 · sagittal · 3.0mm · 0.82mm/px · 4 of 12 slices shown (1 of 2)]
[im 1/12]
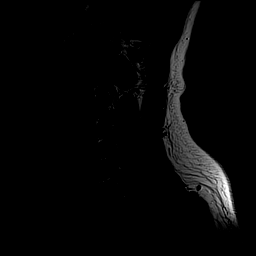
[im 4/12]
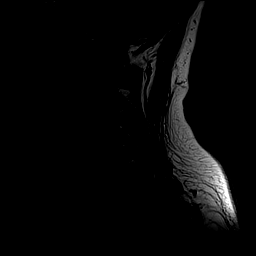
[im 8/12]
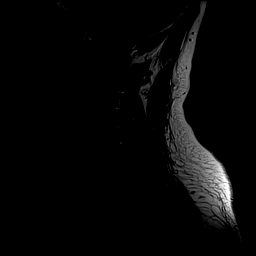
[im 12/12]
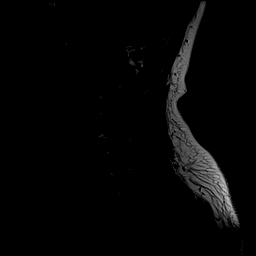

[Series 5: STIR · sagittal · 3.0mm · 0.82mm/px · 4 of 12 slices shown]
[im 1/12]
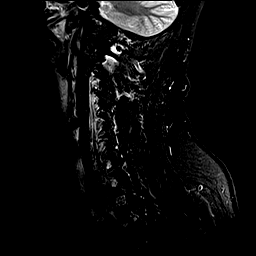
[im 4/12]
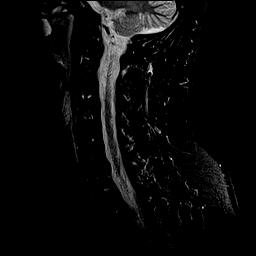
[im 8/12]
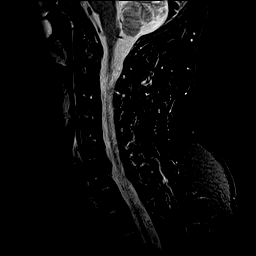
[im 12/12]
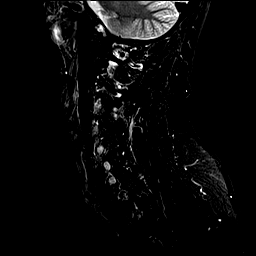

[Series 6: GRE · axial · 3.0mm · 0.74mm/px · z∈[-207,-103]mm · 8 of 28 slices shown]
[im 1/28]
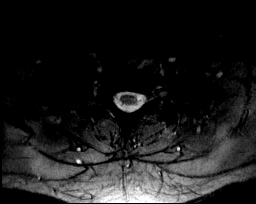
[im 4/28]
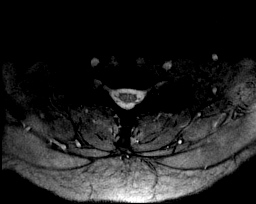
[im 8/28]
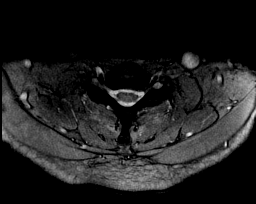
[im 12/28]
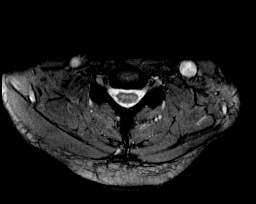
[im 16/28]
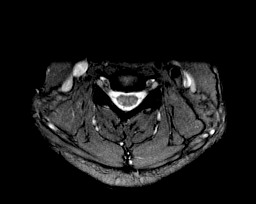
[im 20/28]
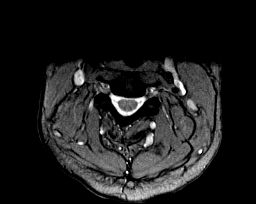
[im 24/28]
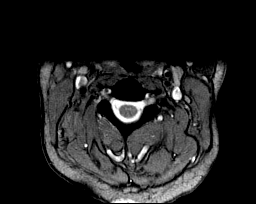
[im 28/28]
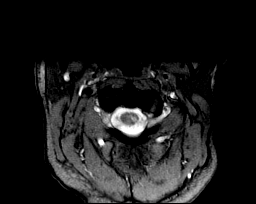

[Series 7: T2 · axial · 3.0mm · 0.74mm/px · z∈[-207,-103]mm · 8 of 28 slices shown (1 of 2)]
[im 1/28]
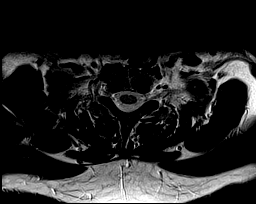
[im 4/28]
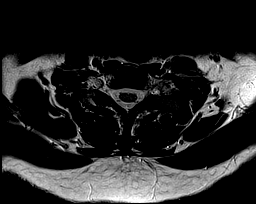
[im 8/28]
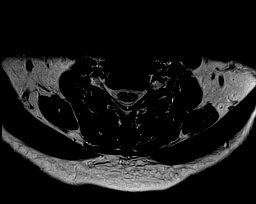
[im 12/28]
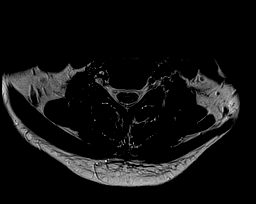
[im 16/28]
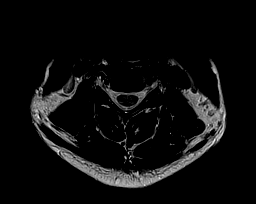
[im 20/28]
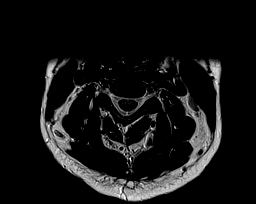
[im 24/28]
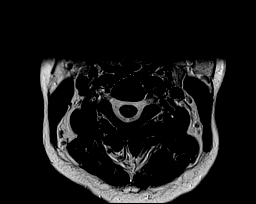
[im 28/28]
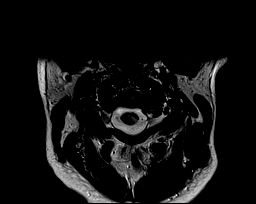

[Series 9: T1 · axial · 3.0mm · 0.78mm/px · z∈[-208,-104]mm · 8 of 28 slices shown (2 of 2)]
[im 1/28]
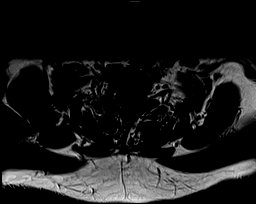
[im 4/28]
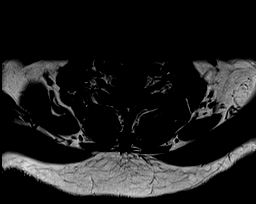
[im 8/28]
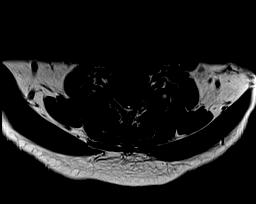
[im 12/28]
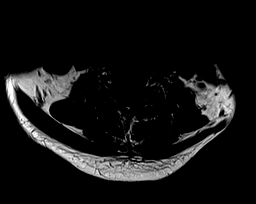
[im 16/28]
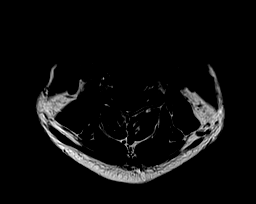
[im 20/28]
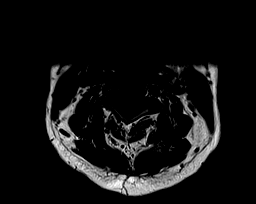
[im 24/28]
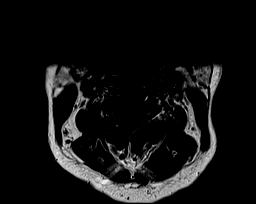
[im 28/28]
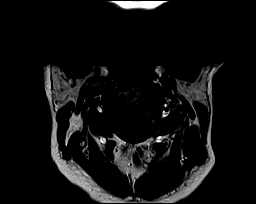

[Series 10: T2 · sagittal · 3.0mm · 0.82mm/px · 4 of 12 slices shown (2 of 2)]
[im 1/12]
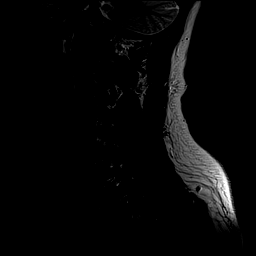
[im 4/12]
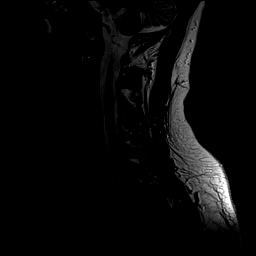
[im 8/12]
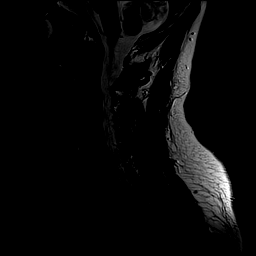
[im 12/12]
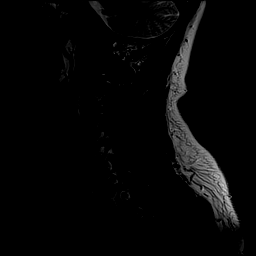

[Series 12: T1 post-contrast · axial · 3.0mm · 0.78mm/px · z∈[-208,-104]mm · 8 of 28 slices shown]
[im 1/28]
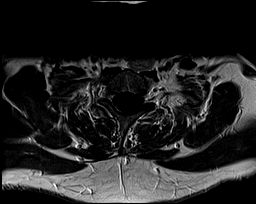
[im 4/28]
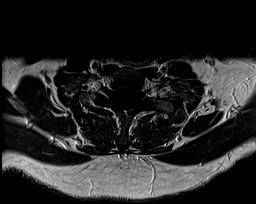
[im 8/28]
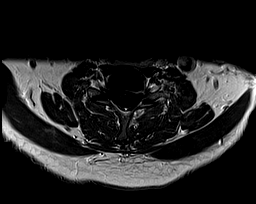
[im 12/28]
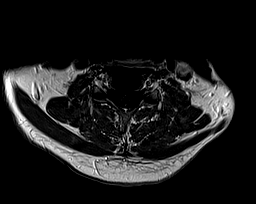
[im 16/28]
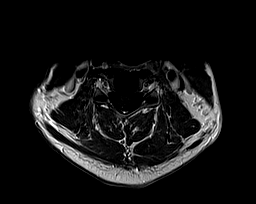
[im 20/28]
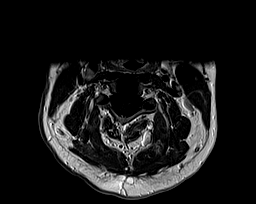
[im 24/28]
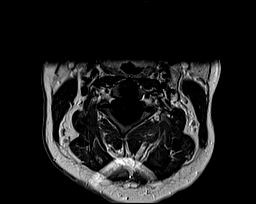
[im 28/28]
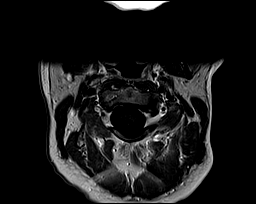

[Series 13: T1 fat-sat post-contrast · sagittal · 3.0mm · 0.82mm/px · 4 of 12 slices shown]
[im 1/12]
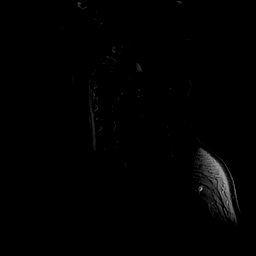
[im 4/12]
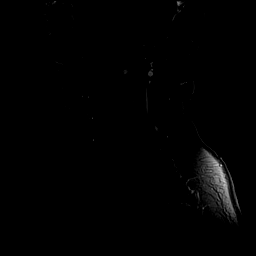
[im 8/12]
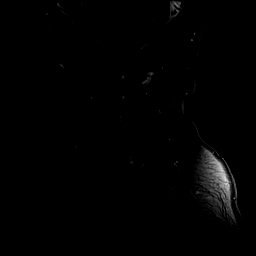
[im 12/12]
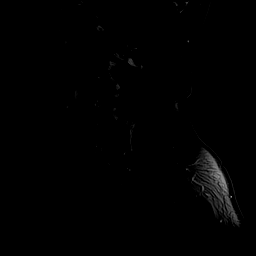

[48 of 48 positions shown; findings below may reference images not displayed]

FINDINGS: MRI HEAD FINDINGS

Brain: Mild diffuse prominence of the CSF containing spaces
compatible with cerebral atrophy, mildly progressed for patient age.
Multiple scattered T2/FLAIR hyperintense lesions are seen involving
the periventricular, deep, and juxta cortical white matter of both
cerebral hemispheres. Several of these foci are oriented
perpendicular to the lateral ventricles with prominent callososeptal
involvement. Infratentorial involvement is seen with a lesion
positioned at the right ventral pons (series 7, image 9). Multiple
corresponding T1 black holes. Findings consistent with demyelinating
disease multiple sclerosis. Subtle patchy enhancement seen about a
juxta cortical lesion at the posterior right parieto-occipital
region, consistent with active demyelination (series 14, image 3).
No other significant evidence for active demyelination identified.

No evidence for acute or subacute infarct. Gray-white matter
differentiation maintained. No encephalomalacia to suggest chronic
cortical infarction. No evidence for acute or chronic intracranial
hemorrhage.

No mass lesion, midline shift or mass effect. No hydrocephalus or
extra-axial fluid collection. Pituitary gland suprasellar region
normal. No other abnormal enhancement.

Vascular: Major intracranial vascular flow voids are well
maintained.

Skull and upper cervical spine: Craniocervical junction normal. Bone
marrow signal intensity normal. No focal marrow replacing lesion. No
scalp soft tissue abnormality.

Sinuses/Orbits: Globes and orbital soft tissues demonstrate no acute
finding. Axial myopia noted. Scattered mucosal thickening noted
throughout the ethmoidal air cells and maxillary sinuses. Paranasal
sinuses are otherwise clear. No mastoid effusion. Inner ear
structures grossly normal.

Other: None.

MRI CERVICAL SPINE FINDINGS

Alignment: Straightening of the normal cervical lordosis. No
listhesis.

Vertebrae: Vertebral body height maintained without acute or chronic
fracture. Bone marrow signal intensity within normal limits. No
discrete or worrisome osseous lesions. No abnormal marrow edema or
enhancement.

Cord: Patchy signal abnormality seen involving the left hemi cord at
the level of C2 (series 7, image 1). Additional subtle focus of
signal abnormality seen at the right dorsal cord at the level of
C4-5 (series 7, image 13). Findings consistent with demyelinating
disease/multiple sclerosis. No other appreciable cord signal
abnormality. No abnormal enhancement to suggest active
demyelination. Overall cord caliber and morphology within normal
limits without significant atrophy.

Posterior Fossa, vertebral arteries, paraspinal tissues:
Craniocervical junction normal. Paraspinous and prevertebral soft
tissues within normal limits. Normal flow voids seen within the
vertebral arteries bilaterally.

Disc levels:

C2-C3: Unremarkable.

C3-C4: Mild annular disc bulge. No significant canal or foraminal
stenosis.

C4-C5:  Minimal annular disc bulge.  No canal or foraminal stenosis.

C5-C6:  Mild annular disc bulge.  No canal or foraminal stenosis.

C6-C7: Mild disc bulge with uncovertebral hypertrophy. No
significant spinal stenosis. Mild left C7 foraminal narrowing. Right
neural foramina remains patent.

C7-T1: Normal interspace. Mild left-sided facet hypertrophy. No
stenosis.

Visualized upper thoracic spine demonstrates no significant finding.
IMPRESSION: MRI HEAD IMPRESSION:

1. Patchy multifocal T2/FLAIR hyperintense lesions involving the
supratentorial and infratentorial cerebral white matter as above,
consistent with demyelinating disease/multiple sclerosis. Associated
mild patchy enhancement about a juxta cortical lesion at the
posterior right parieto-occipital region, consistent with active
demyelination.
2. No other acute intracranial abnormality.

MRI CERVICAL SPINE IMPRESSION:

1. Patchy cord signal abnormality involving the left hemi cord at
the level of C2 and right dorsal cord at C4-5, consistent with
demyelinating disease/multiple sclerosis. No evidence for active
demyelination.
2. Mild noncompressive disc bulging at C3-4 through C6-7 without
significant spinal stenosis. Mild left C7 foraminal narrowing.

## 2020-06-26 MED ORDER — GADOBENATE DIMEGLUMINE 529 MG/ML IV SOLN
18.0000 mL | Freq: Once | INTRAVENOUS | Status: AC | PRN
  Administered 2020-06-26: 18 mL via INTRAVENOUS

## 2020-07-21 ENCOUNTER — Ambulatory Visit: Payer: BC Managed Care – PPO | Admitting: Neurology

## 2020-08-10 ENCOUNTER — Encounter (HOSPITAL_COMMUNITY): Payer: Self-pay

## 2020-08-10 ENCOUNTER — Other Ambulatory Visit: Payer: Self-pay

## 2020-08-10 ENCOUNTER — Encounter (HOSPITAL_COMMUNITY)
Admission: RE | Admit: 2020-08-10 | Discharge: 2020-08-10 | Disposition: A | Discharge status: Home / Self Care | Payer: BC Managed Care – PPO | Source: Ambulatory Visit | Attending: Neurology | Admitting: Neurology

## 2020-08-10 DIAGNOSIS — G35 Multiple sclerosis: Secondary | ICD-10-CM | POA: Diagnosis present

## 2020-08-10 MED ORDER — SODIUM CHLORIDE 0.9 % IV SOLN
1000.0000 mg | Freq: Every day | INTRAVENOUS | Status: DC
  Administered 2020-08-10: 1000 mg via INTRAVENOUS
  Filled 2020-08-10: qty 8

## 2020-08-10 MED ORDER — SODIUM CHLORIDE 0.9 % IV SOLN
Freq: Once | INTRAVENOUS | Status: AC
  Administered 2020-08-10: 20 mL via INTRAVENOUS

## 2020-08-11 ENCOUNTER — Encounter (HOSPITAL_COMMUNITY)
Admission: RE | Admit: 2020-08-11 | Discharge: 2020-08-11 | Disposition: A | Discharge status: Home / Self Care | Payer: BC Managed Care – PPO | Source: Ambulatory Visit | Attending: Neurology | Admitting: Neurology

## 2020-08-11 DIAGNOSIS — G35 Multiple sclerosis: Secondary | ICD-10-CM | POA: Diagnosis not present

## 2020-08-11 MED ORDER — SODIUM CHLORIDE 0.9 % IV SOLN
1000.0000 mg | Freq: Every day | INTRAVENOUS | Status: DC
  Administered 2020-08-11: 1000 mg via INTRAVENOUS
  Filled 2020-08-11: qty 8

## 2020-08-11 MED ORDER — SODIUM CHLORIDE 0.9 % IV SOLN: Freq: Once | INTRAVENOUS | Status: AC

## 2020-08-12 ENCOUNTER — Encounter (HOSPITAL_COMMUNITY)
Admission: RE | Admit: 2020-08-12 | Discharge: 2020-08-12 | Disposition: A | Discharge status: Home / Self Care | Payer: BC Managed Care – PPO | Source: Ambulatory Visit | Attending: Neurology | Admitting: Neurology

## 2020-08-12 ENCOUNTER — Other Ambulatory Visit: Payer: Self-pay

## 2020-08-12 DIAGNOSIS — G35 Multiple sclerosis: Secondary | ICD-10-CM | POA: Diagnosis not present

## 2020-08-12 MED ORDER — SODIUM CHLORIDE 0.9 % IV SOLN
1000.0000 mg | Freq: Once | INTRAVENOUS | Status: AC
  Administered 2020-08-12: 1000 mg via INTRAVENOUS
  Filled 2020-08-12: qty 8

## 2020-08-12 MED ORDER — SODIUM CHLORIDE 0.9 % IV SOLN: Freq: Once | INTRAVENOUS | Status: AC

## 2020-09-02 ENCOUNTER — Ambulatory Visit: Payer: BC Managed Care – PPO | Admitting: Neurology

## 2020-09-27 ENCOUNTER — Ambulatory Visit: Payer: BC Managed Care – PPO | Admitting: Neurology

## 2020-10-01 ENCOUNTER — Ambulatory Visit (INDEPENDENT_AMBULATORY_CARE_PROVIDER_SITE_OTHER): Payer: BC Managed Care – PPO | Admitting: Neurology

## 2020-10-01 ENCOUNTER — Encounter: Payer: Self-pay | Admitting: Neurology

## 2020-10-01 ENCOUNTER — Telehealth: Payer: Self-pay | Admitting: *Deleted

## 2020-10-01 ENCOUNTER — Other Ambulatory Visit: Payer: Self-pay

## 2020-10-01 VITALS — BP 123/85 | HR 83 | Ht 66.0 in | Wt 203.0 lb

## 2020-10-01 DIAGNOSIS — R208 Other disturbances of skin sensation: Secondary | ICD-10-CM | POA: Diagnosis not present

## 2020-10-01 DIAGNOSIS — Z79899 Other long term (current) drug therapy: Secondary | ICD-10-CM | POA: Diagnosis not present

## 2020-10-01 DIAGNOSIS — G35 Multiple sclerosis: Secondary | ICD-10-CM | POA: Diagnosis not present

## 2020-10-01 MED ORDER — PREDNISONE 50 MG PO TABS: ORAL_TABLET | ORAL | 0 refills | Status: DC

## 2020-10-01 NOTE — Telephone Encounter (Signed)
Labs collected 10/01/20. Placed in [facility] box for pick up.

## 2020-10-01 NOTE — Progress Notes (Signed)
[facility]  PATIENT: Randall Butler DOB: 1981/02/10  REFERRING DOCTOR OR PCP: Daria Pastures, PA SOURCE: Patient, notes from primary care, notes from [facility] neurology, MRI and lab reports, MRI images personally reviewed.  _________________________________   HISTORICAL  CHIEF COMPLAINT:  Chief Complaint  Patient presents with   Follow-up    Rm 13 alone Pt is well, trying to get MS under control, Currently having a flare up now for about 2 weeks.     HISTORY OF PRESENT ILLNESS:  I had the pleasure of seeing your patient, Randall Butler, at the [facility] at [facility] for neurologic consultation regarding his multiple sclerosis.  Randall Butler is a 40 year old man who was diagnosed with MS March 2022.   In 2014 he had numbness in his right arm and he had an MRI of the cervical spine and was told he had a spot in his spinal cord but MS was not mentioned and he had no follow up.   In November 2021, he had bilateral numbness in his feet and legs.   This improved over a few months.    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   He experienced numbness form the lower abdomen and down in March 2022 and had a tight sensation in the groin.   Symptom were worse in hot water.    He saw [facility] March 2022.    He had an MRi of the brain and cervical spine which showed changes c/w MS.  He had 3 days of IV Solu-medrol and numbness improved, though he still has a sensation in his feet like there is a balled up sock.    He has not yet started a DMT.    Currently, he notes a Lhermitte sign consisting of a pulling sensation in the spine and a shooting sensation in his legs when he bends his neck down.    He is walking well though he feels very slightly off at times.  No falls and no trouble going down stairs.   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     Strength is fine.   Bladder is  fine.   Vision is fine.   He denies fatigue.   He notes some word finding issues, especially with names of people and things.    Mood is fine.    When the dysesthesias were bothering him more, Xanax helped some.   Imaging personally reviewed today: MRI brain 06/26/2020 shows T2/FLAIR hyperintense foci in the right pons near the dorsal root entry zone of the trigeminal nerve, left midbrain and in the periventricular, juxtacortical and deep white matter of both hemispheres.  One small focus in the juxtacortical white matter of the right parieto-occipital lobe had subtle enhancement.  MRI cervical spine 06/26/2020 showed T2 hyperintense foci just below the cervicomedullary junction, in the left posterolateral spinal cord adjacent to C1-C2, and the right posterolateral spinal cord adjacent to C5.  Mild degenerative changes at C5-C6 and C6-C7 not cord and spinal stenosis or nerve root compression.   REVIEW OF SYSTEMS: Constitutional: No fevers, chills, sweats, or change in appetite Eyes: No visual changes, double vision, eye pain Ear, nose and throat: No hearing loss, ear pain, nasal congestion, sore throat Cardiovascular: No chest pain, palpitations Respiratory:  No shortness of breath at rest or with exertion.   No wheezes GastrointestinaI: No nausea, vomiting, diarrhea, abdominal pain, fecal incontinence  Genitourinary:  No dysuria, urinary retention or frequency.  No nocturia. Musculoskeletal:  No neck pain, back pain Integumentary: No rash, pruritus, skin lesions Neurological: as above Psychiatric: No depression at this time.  No anxiety Endocrine: No palpitations, diaphoresis, change in appetite, change in weigh or increased thirst Hematologic/Lymphatic:  No anemia, purpura, petechiae. Allergic/Immunologic: No itchy/runny eyes, nasal congestion, recent allergic reactions, rashes  ALLERGIES: Allergies  Allergen Reactions   Penicillins Other (See Comments)    Per pt. His mother has always  told him that he was allergic to PCN , but he is not sure of what type of reaction he had     HOME MEDICATIONS:  Current Outpatient Medications:    predniSONE (DELTASONE) 50 MG tablet, 12 po qd (600 mg po qd) x 3 days, Disp: 36 tablet, Rfl: 0  PAST MEDICAL HISTORY: History reviewed. No pertinent past medical history.  PAST SURGICAL HISTORY: Past Surgical History:  Procedure Laterality Date   right inguinal hernia repair Right     FAMILY HISTORY: History reviewed. No pertinent family history.  SOCIAL HISTORY:  Social History   Socioeconomic History   Marital status: Divorced    Spouse name: Not on file   Number of children: Not on file   Years of education: Not on file   Highest education level: Not on file  Occupational History   Not on file  Tobacco Use   Smoking status: Never   Smokeless tobacco: Never  Substance and Sexual Activity   Alcohol use: Yes    Alcohol/week: 0.0 standard drinks    Comment: rare social   Drug use: Never   Sexual activity: Not on file  Other Topics Concern   Not on file  Social History Narrative   Not on file   Social Determinants of Health   Financial Resource Strain: Not on file  Food Insecurity: Not on file  Transportation Needs: Not on file  Physical Activity: Not on file  Stress: Not on file  Social Connections: Not on file  Intimate Partner Violence: Not on file     PHYSICAL EXAM  Vitals:   10/01/20 1016  BP: 123/85  Pulse: 83  Weight: 203 lb (92.1 kg)  Height: 5\' 6"  (1.676 m)    Body mass index is 32.77 kg/m.   General: The patient is well-developed and well-nourished and in no acute distress  HEENT:  Head is [place]/AT.  Sclera are anicteric.  Funduscopic exam shows normal optic discs and retinal vessels.  Neck: No carotid bruits are noted.  The neck is nontender.  Cardiovascular: The heart has a regular rate and rhythm with a normal S1 and S2. There were no murmurs, gallops or rubs.    Skin: Extremities are  without rash or  edema.  Musculoskeletal:  Back is nontender  Neurologic Exam  Mental status: The patient is alert and oriented x 3 at the time of the examination. The patient has apparent normal recent and remote memory, with an apparently normal attention span and concentration ability.   Speech is normal.  Cranial nerves: Extraocular movements are full. Pupils are equal, round, and reactive to light and accomodation.  Color vision was normal and symmetric visual fields are full.  Facial symmetry is present. There is good facial sensation to soft touch bilaterally.Facial strength is normal.  Trapezius and sternocleidomastoid strength is normal. No dysarthria is noted.  The tongue is midline, and the patient has symmetric elevation of the soft palate. No obvious hearing deficits are noted.  Motor:  Muscle bulk is normal.   Tone is normal. Strength is  5 / 5 in all 4 extremities.   Sensory: Sensory testing is intact to pinprick, soft touch and vibration sensation in the arms and reduced vibration in left foot only.   He had normal sensation to temperature in the trunk.  Coordination: Cerebellar testing reveals good finger-nose-finger and heel-to-shin bilaterally.  Gait and station: Station is normal.   Gait is normal. Tandem gait is normal. Romberg is negative.   Reflexes: Deep tendon reflexes are symmetric and normal bilaterally.   Plantar responses are flexor.    DIAGNOSTIC DATA (LABS, IMAGING, TESTING) - I reviewed patient records, labs, notes, testing and imaging myself where available.  Lab Results  Component Value Date   WBC 11.6 (H) 09/12/2018   HGB 15.8 09/12/2018   HCT 48.3 09/12/2018   MCV 87.7 09/12/2018   PLT 221 09/12/2018      Component Value Date/Time   NA 137 09/12/2018 0838   K 3.5 09/12/2018 0838   CL 102 09/12/2018 0838   CO2 24 09/12/2018 0838   GLUCOSE 138 (H) 09/12/2018 0838   BUN 23 (H) 09/12/2018 0838   CREATININE 1.04 09/12/2018 0838   CALCIUM 9.3  09/12/2018 0838   PROT 7.2 09/12/2018 0838   ALBUMIN 4.3 09/12/2018 0838   AST 22 09/12/2018 0838   ALT 22 09/12/2018 0838   ALKPHOS 51 09/12/2018 0838   BILITOT 1.1 09/12/2018 0838   GFRNONAA >60 09/12/2018 0838   GFRAA >60 09/12/2018 0838       ASSESSMENT AND PLAN  Multiple sclerosis (HCC) - Plan: Stratify JCV Antibody Test ([facility]), Varicella zoster antibody, IgG, CYP2C9 Genotyping Siponimod  High risk medication use - Plan: Stratify JCV Antibody Test ([facility]), Varicella zoster antibody, IgG, CYP2C9 Genotyping Siponimod  Dysesthesia   In summary, Randall Butler is a 40 year old man with a new diagnosis of multiple sclerosis.  Of note, he disease probably started in 2014 when he was told he had a focus in the cervical spinal cord.  Since November, he has had 2 exacerbations as well as some other sensory symptoms that could be fluctuations versus exacerbations.  He feels he is having a small flareup now.  I am uncertain if this represents an actual exacerbation versus a fluctuation in his symptoms but I will have him take 3 days of high-dose oral steroids (600 mg x 3 days).  The sensations are sometimes uncomfortable but he would prefer not to take a daily medication for this at this time.  Consider gabapentin if sensations become more painful.  We spent much of the visit discussing aspects of MS including prognosis, natural history, etiology, and treatment.  He reports that Tysabri had been discussed with him in the past and he is potentially interested in this.  I reviewed the lab work.  He had a JCV DNA PCR but not the antibody test.  Therefore, we need to check the JCV stratify antibody test to help determine his risk.  He understands that if he is JCV antibody middle or high positive I will recommend a different medication and I discussed the S1 P receptor modulators Zeposia and Mayzent in more detail.  I will check P450 2C9 and varicella zoster IgG.  He signed service request forms for all 3  forms and we will await the laboratory tests.  If he requires one of the pills instead of Tysabri we will see about getting an EKG.   Randall Butler A. Epimenio Foot, MD,  PhD,FAAN 10/01/2020, 12:35 PM Certified in Neurology, Clinical Neurophysiology, Sleep Medicine and Neuroimaging  [facility] [address], [address] [place], [place] [address] [phone]

## 2020-10-07 NOTE — Telephone Encounter (Signed)
JCV ab drawn on 10/01/20 positive, index: 1.32. Gave to MD for Review

## 2020-10-08 LAB — CYP2C9 GENOTYPING SIPONIMOD

## 2020-10-08 LAB — VARICELLA ZOSTER ANTIBODY, IGG: Varicella zoster IgG: 3001 index (ref >165)

## 2020-10-12 ENCOUNTER — Telehealth: Payer: Self-pay

## 2020-10-12 NOTE — Telephone Encounter (Signed)
Received a Mayzent start form from Dr. Epimenio Foot.  Dr. Epimenio Foot reports that patient is JCV positive and therefore cannot start Tysabri.  Dr. Epimenio Foot would like patient to have EKG, macular edema, CBC, LFTs prior to starting Mayzent.  Patient will be a 1 mg daily dosing.  I called patient.  I discussed this with him.  He is agreeable to starting Mayzent.  He understands that [employer] will be reaching out to him to schedule a EKG, macular edema screening and some blood work.  Once he is cleared for therapy, he can schedule delivery of patient.  Patient verbalized understanding and agreement.

## 2020-10-25 NOTE — Telephone Encounter (Signed)
I called patient.  I spoke with Ridgefield Lions at [employer].  They have had trouble reaching patient via phone to discuss the screenings.  I called patient.  He declines any call he is unsure of.  I gave him the phone number to call [employer] that to schedule the screenings.  The phone number is [phone].

## 2020-10-26 NOTE — Telephone Encounter (Signed)
Completed mayzent starter kit PA via CMM. Sent to [facility]. Of note, his insurance has his first name listed as "Christoph". Key: [id number].  Completed mayzent $RemoveBeforeDEI'1mg'vDrRHjjgFrAWutES$  dose PA via CMM. Sent to [facility].Key: [id number].

## 2020-10-28 NOTE — Telephone Encounter (Signed)
Pt is asking for a call to discuss a call that he received from [employer] advising him that insurance denied the Mayzent due to missing information.  Please call pt to discuss.

## 2020-11-01 NOTE — Telephone Encounter (Signed)
Received fax from [employer] regarding denial of mayzent. Pt must try glatiramer or dimethyl fumarate.  I spoke with Dr. Epimenio Foot. Glatiramer may contribute to non-compliance. With 2 exacerbations within the past year, he has active MS, and would need a higher efficacy drug, rather than dimethyl fumarate.

## 2020-11-01 NOTE — Telephone Encounter (Signed)
I called [facility] [employer] [place].  The [place] for Mayzent was denied. Formulary is  glatiramer or dimethyl fumarate.  I will refax may be a determination for this so that we may appeal it.  The pharmacy phone number is [phone].

## 2020-11-02 ENCOUNTER — Telehealth: Payer: Self-pay | Admitting: Neurology

## 2020-11-03 NOTE — Telephone Encounter (Signed)
[facility] ([facility]) called, need a new prescription for Mayzent. Can dispense medication during the process for PA. Would like a call from the nurse.  Contact info: [phone]

## 2020-11-03 NOTE — Telephone Encounter (Addendum)
I spoke with Dr. Epimenio Foot. He said it is ok for patient to start mayzent through bridge program while we wait for the appeal decision.  I called [facility].  Spoke with Vido.  The 0.25 mg tablets of Mayzent have been discontinued.  There is a titration pack for the 1 mg maintenance initiation that will be provided.  After the titration pack is completed patient will be taking 1 mg once per day.

## 2020-11-03 NOTE — Telephone Encounter (Signed)
Faxed appeal letter to [facility] appeals unit. Received a receipt of confirmation.

## 2020-11-10 NOTE — Telephone Encounter (Addendum)
Took call from phone staff and spoke w/ Mora Bellman from [facility]. Needed to verify script they received for Mayzent 1mg . Spoke w/ pharmacist, . Faxed image blurry/unreadable. Provided VO for Mayzent 1mg  tablet po qd #30, 11 refills. Aware pt will be getting titration via bridge program. Nothing further needed at this time.

## 2020-11-11 ENCOUNTER — Telehealth: Payer: Self-pay | Admitting: Neurology

## 2020-11-11 NOTE — Telephone Encounter (Signed)
Called pt back. Sx started a couple days ago. Numbness is constant. Staying about the same. No injury. Advised I would speak w/ MD and call back.  Spoke w/ Dr. Epimenio Foot. He states it does not sound typical of MS. Could be CTS. if sx do not resolve over the weekend, he should call beginning of next week. He would then like to schedule him for EMG/NCS for further eval. I called pt back and relayed this. He verbalized understanding.

## 2020-11-11 NOTE — Telephone Encounter (Signed)
Pt is asking for a call to discuss the left middle finger to his left thumb has a bad numbing feeling, please call pt to discuss.

## 2020-11-16 ENCOUNTER — Telehealth: Payer: Self-pay | Admitting: Neurology

## 2020-11-16 NOTE — Telephone Encounter (Signed)
Pt returned call stating that he spoke to someone and they told him that they would reopen starter pack to get that shipped to him. Pt was unaware how long that processed would take, told pt I would call too.  Called mayzent and was able to speak with same lady he spoke with. Erie Noe is his case manager working on his file and she is reaching out to [facility] to get the bridge re opened. She should be getting that process going today. Pt should get a call from [facility] either today or tomorrow.   Called the pt and informed him of this and advised the pt to call us if he has not heard anything.

## 2020-11-16 NOTE — Telephone Encounter (Signed)
Called the patient back and he states that he is having difficulty with getting titration starter pack. I have called the [facility] pharmacy with the patient on a 3 way call to get more information.  Appears that the titration starter pack will need to be sent through [facility] and the maintenance dose comes in [facility] Pt was advised to call mayzent and advise that he is ready to schedule the starter pack then call [facility] back to schedule delivery of the maintenance medication. Pt verbalized understanding. Pt had no questions at this time but was encouraged to call back if questions arise.

## 2020-11-16 NOTE — Telephone Encounter (Signed)
Pt called having some issues getting his medication from [employer]. Pt is requesting a call back.

## 2020-11-17 NOTE — Telephone Encounter (Signed)
Received letter from [employer] [place] that appeal is approved

## 2020-11-22 ENCOUNTER — Telehealth: Payer: Self-pay | Admitting: Neurology

## 2020-11-22 MED ORDER — MAYZENT STARTER PACK 7 X 0.25 MG PO TBPK: 1.0000 | ORAL_TABLET | ORAL | 0 refills | Status: DC

## 2020-11-22 NOTE — Telephone Encounter (Signed)
See telephone note from 11/22/20.

## 2020-11-22 NOTE — Telephone Encounter (Signed)
Pt called stating he is having a hard time getting his Siponimod Fumarate (MAYZENT STARTER PACK) 7 x 0.25 MG TBPK. Says he is sure if he needs to contact [facility] or [facility]. Pt is requesting a call back.

## 2020-11-22 NOTE — Addendum Note (Signed)
Addended by: Geronimo Running A on: 11/22/2020 10:07 AM   Modules accepted: Orders

## 2020-11-22 NOTE — Telephone Encounter (Signed)
[employer] rep is speaking with patient currently. [facility] is the one to be shipping the [employer] and maintenance and they have both prescriptions.

## 2020-11-29 MED ORDER — MAYZENT STARTER PACK 7 X 0.25 MG PO TBPK
1.0000 | ORAL_TABLET | ORAL | 0 refills | Status: DC
Start: 2020-11-29 — End: 2021-07-20

## 2020-11-29 NOTE — Telephone Encounter (Signed)
I called [facility].  They need the Mayzent starter pack prescription. However, they report that patient received a bridge RX for mayzent starter pack and are unsure if they can dispense another mayzent starter.  I called Lois at [employer]. The patient did not receive a bridge mayzent starter pack because the PA was approved. [facility] needs to dispense the starter pack.  I called [facility] again and spoke with a pharmacist. I explained that he did not get the bridge RX and that he needs the mayzent starter kit dispensed. He will expedite this.  I called patient. I explained this to him. He will look for a call from [facility] to set up a shipment date and let me know if there are further issues. I will check with him next week. Pt verbalized understanding and appreciation.

## 2020-11-29 NOTE — Addendum Note (Signed)
Addended by: Geronimo Running A on: 11/29/2020 07:26 AM   Modules accepted: Orders

## 2020-12-07 NOTE — Telephone Encounter (Signed)
I called patient.  He has not received Mayzent yet.  It is supposed to be delivered today.  I will check on him next week to make sure he received it and is tolerating it well.  Patient verbalized understanding.

## 2020-12-14 NOTE — Telephone Encounter (Signed)
I called patient.  He has not received Mayzent yet.  It was supposed to be delivered last week.  He has been in touch with [facility] and [facility].  Unfortunately, they have not been able to resolve whatever issue is happening.  I advised patient that I would reach out to [facility] again as well.  I called [facility].  The only information [facility] would give me was that the Mayzent was supposed to be delivered on December 07, 2020.  I called [facility]'s representative for our area, Lance, and asked him to call me back.

## 2020-12-21 NOTE — Telephone Encounter (Signed)
I called patient to discuss if he received Mayzent.  I was notified by [facility] last week that it was set up to be delivered to him last week.  No answer, left a voicemail asking him to call me back.

## 2020-12-21 NOTE — Telephone Encounter (Signed)
Patient returned my call.  He reports that [facility] is shipping him the Mayzent starter pack for free while [facility] straightens out the issues with sending him the maintenance dose.  He is supposed to receive the starter pack today.  He reports that he spoke with [facility] and they are supposed to send him the maintenance dose.  I will check on him next week to make sure he is doing well on Mayzent.  Patient verbalized understanding.

## 2020-12-27 NOTE — Telephone Encounter (Signed)
I called patient.  He has received his Mayzent starter and maintenance dose.  He plans on starting it today or tomorrow.  I will check on him next week to make sure he is doing well.  Patient verbalized understanding.

## 2021-01-03 NOTE — Telephone Encounter (Signed)
I called patient.  He has started Mayzent.  He is tolerating it well.  I reminded him of his follow-up appointment on October 12 with Dr. Epimenio Foot.  Patient will let us know if he has questions or concerns in the interim.

## 2021-01-19 ENCOUNTER — Encounter: Payer: Self-pay | Admitting: Neurology

## 2021-01-19 ENCOUNTER — Telehealth: Payer: Self-pay | Admitting: Neurology

## 2021-01-19 ENCOUNTER — Telehealth (INDEPENDENT_AMBULATORY_CARE_PROVIDER_SITE_OTHER): Payer: BC Managed Care – PPO | Admitting: Neurology

## 2021-01-19 DIAGNOSIS — Z79899 Other long term (current) drug therapy: Secondary | ICD-10-CM | POA: Diagnosis not present

## 2021-01-19 DIAGNOSIS — G35 Multiple sclerosis: Secondary | ICD-10-CM | POA: Diagnosis not present

## 2021-01-19 DIAGNOSIS — R208 Other disturbances of skin sensation: Secondary | ICD-10-CM | POA: Diagnosis not present

## 2021-01-19 NOTE — Telephone Encounter (Signed)
Pattricia Boss called pt back and offered to convert appt to mychart VV. Pt accepted, appt converted and MD made aware.

## 2021-01-19 NOTE — Telephone Encounter (Signed)
Pt cancelled appt due not able to get here in time.

## 2021-01-19 NOTE — Progress Notes (Signed)
[facility]  PATIENT: Randall Butler DOB: September 16, 1980  REFERRING DOCTOR OR PCP: Randall Hacker, PA SOURCE: Patient, notes from primary care, notes from Randall [facility], MRI and lab reports, MRI images personally reviewed.  _________________________________   HISTORICAL  CHIEF COMPLAINT:  No chief complaint on file.  Virtual Visit via Video Note I connected with Randall Butler on 01/19/21 at  4:00 PM EDT by a video enabled telemedicine application and verified that I am speaking with Randall correct person.  I discussed Randall limitations of evaluation and management by telemedicine and Randall availability of in person appointments. Randall patient expressed understanding and agreed to proceed.  After about 5 minutes, Randall connection was lost and I completed Randall visit by phone  Patient was in Randall car and Randall provider was in Randall office  History of Present Illness:  HISTORY OF PRESENT ILLNESS:  Randall Butler, at Randall [facility] for neurologic consultation regarding his multiple sclerosis.  Update 01/19/2021: He had some problems getting started on Mayzent but just started Mayzent 2 weeks ago.  He had fatigue Randall first few days so after Randall first week he began to take it at bedtime.  He tolerates that better.  No new neurologic symptoms since Randall last time I saw him.  He has been taking CBD oil and feels it helps.  Specifically, since he started he noted more sensation in his feet.     Randall Lhermitte sign has resolved for Randall most part though he has had one or two mild episodes.  Left leg is slightly weak, more noticeable if more active.  He feels mildly unstable on stairs.    He is walking well though he feels very slightly off at times.  No falls and no trouble going down stairs.   He works a physical job Librarian, academic and cases of beer.   Strength is fine.   Bladder is fine.   Vision is fine.     He denies fatigue.   He notes some  word finding issues, especially with names of people and things.    Mood is fine.       MS HISTORY Randall Butler is a 40 year old man who was diagnosed with MS March 2022.   In 2014 he had numbness in his right arm and he had an MRI of Randall cervical spine and was told he had a spot in his spinal cord but MS was not mentioned and he had no follow up.   In November 2021, he had bilateral numbness in his feet and legs.   This improved, incompletely, over a few months.    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   He experienced numbness form Randall lower abdomen and down in March 2022 and had a tight sensation in Randall groin.   Symptom were worse in hot water.    He saw Randall Butler March 2022.    He had an MRi of Randall brain and cervical spine which showed changes c/w MS.  He had 3 days of IV Solu-medrol and numbness improved but he continued to have a sensation in his feet like there is a balled up sock.   Imaging personally reviewed today: MRI brain 06/26/2020 shows T2/FLAIR hyperintense foci in Randall right pons near Randall dorsal root entry zone of Randall trigeminal nerve, left midbrain and in Randall periventricular, juxtacortical and deep white matter of both hemispheres.  One  small focus in Randall juxtacortical white matter of Randall right parieto-occipital lobe had subtle enhancement.  MRI cervical spine 06/26/2020 showed T2 hyperintense foci just below Randall cervicomedullary junction, in Randall left posterolateral spinal cord adjacent to C1-C2, and Randall right posterolateral spinal cord adjacent to C5.  Mild degenerative changes at C5-C6 and C6-C7 not cord and spinal stenosis or nerve root compression.   REVIEW OF SYSTEMS: Constitutional: No fevers, chills, sweats, or change in appetite Eyes: No visual changes, double vision, eye pain Ear, nose and throat: No hearing loss, ear pain, nasal congestion, sore throat Cardiovascular: No chest pain, palpitations Respiratory:  No  shortness of breath at rest or with exertion.   No wheezes GastrointestinaI: No nausea, vomiting, diarrhea, abdominal pain, fecal incontinence Genitourinary:  No dysuria, urinary retention or frequency.  No nocturia. Musculoskeletal:  No neck pain, back pain Integumentary: No rash, pruritus, skin lesions Neurological: as above Psychiatric: No depression at this time.  No anxiety Endocrine: No palpitations, diaphoresis, change in appetite, change in weigh or increased thirst Hematologic/Lymphatic:  No anemia, purpura, petechiae. Allergic/Immunologic: No itchy/runny eyes, nasal congestion, recent allergic reactions, rashes  ALLERGIES: Allergies  Allergen Reactions   Penicillins Other (See Comments)    Per pt. His mother has always told him that he was allergic to PCN , but he is not sure of what type of reaction he had     HOME MEDICATIONS:  Current Outpatient Medications:    predniSONE (DELTASONE) 50 MG tablet, 12 po qd (600 mg po qd) x 3 days, Disp: 36 tablet, Rfl: 0   Siponimod Fumarate (MAYZENT STARTER PACK) 7 x 0.25 MG TBPK, Take 1 kit by mouth See admin instructions. Day 1: 1 x 0.$Rem'25mg'ifzI$  Day 2: 1 x 0.$Rem'25mg'ChWw$  Day 3: 2 x 0.$Rem'25mg'WzBz$  Day 4: 3 x 0.$Rem'25mg'dGUW$  Day 5 and every day after: 4 x 0.$Rem'25mg'dDvT$ , Disp: 1 each, Rfl: 0   Siponimod Fumarate (MAYZENT) 1 MG TABS, Take 1 mg by mouth daily., Disp: , Rfl:   PAST MEDICAL HISTORY: No past medical history on file.  PAST SURGICAL HISTORY: Past Surgical History:  Procedure Laterality Date   right inguinal hernia repair Right     FAMILY HISTORY: No FH of MS    PHYSICAL EXAM  He is a well-developed well-nourished man in no acute distress.  Randall head is normocephalic and atraumatic.  Sclera are anicteric.  Visible skin appears normal.  Randall neck has a good range of motion.   He is alert and fully oriented with fluent speech and good attention, knowledge and memory.  Extraocular muscles are intact.  Facial strength is normal.    He appears to have normal  strength in Randall arms.  Rapid alternating movements and finger-nose-finger are performed well.      DIAGNOSTIC DATA (LABS, IMAGING, TESTING) - I reviewed patient records, labs, notes, testing and imaging myself where available.  Lab Results  Component Value Date   WBC 11.6 (H) 09/12/2018   HGB 15.8 09/12/2018   HCT 48.3 09/12/2018   MCV 87.7 09/12/2018   PLT 221 09/12/2018      Component Value Date/Time   NA 137 09/12/2018 0838   K 3.5 09/12/2018 0838   CL 102 09/12/2018 0838   CO2 24 09/12/2018 0838   GLUCOSE 138 (H) 09/12/2018 0838   BUN 23 (H) 09/12/2018 0838   CREATININE 1.04 09/12/2018 0838   CALCIUM 9.3 09/12/2018 0838   PROT 7.2 09/12/2018 0838   ALBUMIN 4.3 09/12/2018 0838   AST 22 09/12/2018  0838   ALT 22 09/12/2018 0838   ALKPHOS 51 09/12/2018 0838   BILITOT 1.1 09/12/2018 0838   GFRNONAA >60 09/12/2018 0838   GFRAA >60 09/12/2018 0838       ASSESSMENT AND PLAN  Multiple sclerosis ([place])  High risk medication use  Dysesthesia   Continue Mayzent.  Around Randall time of Randall next visit we will check some blood work.  Sometime next year we will to check an MRI to determine if there is any subclinical progression.  There was a period of several months before he started Randall Allisonia which would need to be taken into account if there are some foci. Stay active and exercise as tolerated. He is going back on Randall CBD oil.  If dysesthesias worsen we could consider gabapentin. He will return to see [place] in 3 months or sooner if there are new or worsening neurologic symptoms.   Follow Up Instructions: I discussed Randall assessment and treatment plan with Randall patient. Randall patient was provided an opportunity to ask questions and all were answered. Randall patient agreed with Randall plan and demonstrated an understanding of Randall instructions.    Randall patient was advised to call back or seek an in-person evaluation if Randall symptoms worsen or if Randall condition fails to improve as  anticipated.  I provided 20 minutes of non-face-to-face time during this encounter.   Jeanenne Licea A. Felecia Shelling, MD, [facility] [id number], 4:12 PM Certified in Butler, Clinical Neurophysiology, Sleep Medicine and Neuroimaging  [facility] [address], [place], [place] [address] [phone]

## 2021-03-15 ENCOUNTER — Telehealth: Payer: Self-pay | Admitting: Neurology

## 2021-03-15 DIAGNOSIS — G35 Multiple sclerosis: Secondary | ICD-10-CM

## 2021-03-15 MED ORDER — PREDNISONE 50 MG PO TABS: ORAL_TABLET | ORAL | 0 refills | Status: DC

## 2021-03-15 NOTE — Telephone Encounter (Signed)
Spoke w/ Dr. Epimenio Foot. He does not feel this is r/t to Mayzent. Would like him to take Prednisone 50mg , 12 tabs po daily x3 days #36. I called pt. Relayed. He verbalized understanding. He will call back if sx persist after taking.

## 2021-03-15 NOTE — Telephone Encounter (Signed)
Pt called thinks the Siponimod Fumarate (MAYZENT) 1 MG TABS is causing mild flare ups. Pt is under the impression that the medication is supposed to stop that. Pt requesting a call back.

## 2021-03-15 NOTE — Telephone Encounter (Signed)
Called pt back. He feels he is experiencing Lhermitte's sign again. Occurs when he looks down. Going down left side to upper left thigh (was in back before). Had left testicle sensation change a couple weeks ago but resolved. States this started again today. Has not started any new meds recently. No vision changes. No signs/sx infection. Having bowel changes. Taking longer to go/straining more  Aware to make sure to drink plenty of fluids. He is increasing fiber/[facility] as well.   Made him aware unlikely Mayzent causing sx. Could be r/t MS. Last MRI brain/cervical 06/2020. Aware I will speak w/ MD and call him back for next steps.

## 2021-03-15 NOTE — Telephone Encounter (Signed)
Would you recommend steroid?

## 2021-07-06 ENCOUNTER — Telehealth: Payer: Self-pay | Admitting: Neurology

## 2021-07-06 NOTE — Telephone Encounter (Signed)
Jithesh John(pharmacy tech)w/ [facility] is asking if there has been a gap in pt's Mayzent therapy.  Their call back # is [phone] xt [phone] ?

## 2021-07-07 NOTE — Telephone Encounter (Signed)
Called pt. He states he has not missed any doses of Mayzent. [employer] had sent him medication prior to getting med from [facility], therefore, he had extra.  ? ?I called [facility] back and relayed above info. Spoke w/ Elane Fritz. She verbalized understanding. Nothing further needed.  ?

## 2021-07-20 ENCOUNTER — Encounter: Payer: Self-pay | Admitting: Neurology

## 2021-07-20 ENCOUNTER — Ambulatory Visit (INDEPENDENT_AMBULATORY_CARE_PROVIDER_SITE_OTHER): Payer: BC Managed Care – PPO | Admitting: Neurology

## 2021-07-20 VITALS — BP 121/89 | HR 94 | Ht 66.0 in | Wt 218.0 lb

## 2021-07-20 DIAGNOSIS — R29818 Other symptoms and signs involving the nervous system: Secondary | ICD-10-CM | POA: Diagnosis not present

## 2021-07-20 DIAGNOSIS — G35 Multiple sclerosis: Secondary | ICD-10-CM

## 2021-07-20 DIAGNOSIS — R208 Other disturbances of skin sensation: Secondary | ICD-10-CM

## 2021-07-20 DIAGNOSIS — Z79899 Other long term (current) drug therapy: Secondary | ICD-10-CM | POA: Diagnosis not present

## 2021-07-20 DIAGNOSIS — H9193 Unspecified hearing loss, bilateral: Secondary | ICD-10-CM

## 2021-07-20 NOTE — Progress Notes (Signed)
? ?[facility] ? ?PATIENT: Randall Butler ?DOB: 1980/10/29 ? ?REFERRING DOCTOR OR PCP: Daria Pastures, PA ?SOURCE: Patient, notes from primary care, notes from [facility] neurology, MRI and lab reports, MRI images personally reviewed. ? ?_________________________________ ? ? ?HISTORICAL ? ?CHIEF COMPLAINT:  ?Chief Complaint  ?Patient presents with  ? Follow-up  ?  RM 1, alone. MS DMT: Mayzent. No new sx. Doing well.   ? ? ?HISTORY OF PRESENT ILLNESS:  ?Randall Butler is a 41 y.o. man with multiple sclerosis. ? ?Update 07/20/2021: ?He is on Mayzent.  .  He had fatigue the first few days so after the first week he began to take it at bedtime.  He tolerates that better.  No new neurologic symptoms since the last time I saw him.    No exacerbation.   ? ?His gait is usually good though he has some stumbles.   No falls.   He has no trouble going down stairs..   Strength is fine.      He has a Lhermitte sign but this is stable.   He has mild numbness in his toes..   Bladder is fine.   Vision is fine.    ? ?He denies much fatigue.  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   He feels his memory is worse.    He notes some word finding issues, especially with names of people and things.    Mood is fine.    Sleep is variable due to his job.  He snores some but has never been told he has pauses, snorts or gasps at night.     ? ?He has reduced hearing and wears hearing aids.    Sometimes discriminating words can be more difficult.   ? ?MS HISTORY ?[name] is a 41 year old man who was diagnosed with MS March 2022.   In 2014 he had numbness in his right arm and he had an MRI of the cervical spine and was told he had a spot in his spinal cord but MS was not mentioned and he had no follow up.   In November 2021, he had bilateral numbness in his feet and legs.   This improved, incompletely, over a few months.    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   He experienced numbness form the lower abdomen and down in March 2022 and had a tight sensation in the groin.   Symptom were worse in hot water.    He saw [facility] March 2022.    He had an MRi of the brain and cervical spine which showed changes c/w MS.  He had 3 days of IV Solu-medrol and numbness improved but he continued to have a sensation in his feet like there is a balled up sock.  ? ?Imaging personally reviewed today: ?MRI brain 06/26/2020 shows T2/FLAIR hyperintense foci in the right pons near the dorsal root entry zone of the trigeminal nerve, left midbrain and in the periventricular, juxtacortical and deep white matter of both hemispheres.  One small focus in the juxtacortical white matter of the right parieto-occipital lobe had subtle enhancement. ? ?MRI cervical spine 06/26/2020 showed T2 hyperintense foci just below the cervicomedullary junction, in the left posterolateral spinal cord adjacent to C1-C2, and the right posterolateral spinal cord adjacent to C5.  Mild degenerative changes at C5-C6 and C6-C7 not cord and  spinal stenosis or nerve root compression. ? ? ?REVIEW OF SYSTEMS: ?Constitutional: No fevers, chills, sweats, or change in appetite ?Eyes: No visual changes, double vision, eye pain ?Ear, nose and throat: No hearing loss, ear pain, nasal congestion, sore throat ?Cardiovascular: No chest pain, palpitations ?Respiratory:  No shortness of breath at rest or with exertion.   No wheezes ?GastrointestinaI: No nausea, vomiting, diarrhea, abdominal pain, fecal incontinence ?Genitourinary:  No dysuria, urinary retention or frequency.  No nocturia. ?Musculoskeletal:  No neck pain, back pain ?Integumentary: No rash, pruritus, skin lesions ?Neurological: as above ?Psychiatric: No depression at this time.  No anxiety ?Endocrine: No palpitations, diaphoresis, change in appetite, change in weigh or increased thirst ?Hematologic/Lymphatic:  No anemia,  purpura, petechiae. ?Allergic/Immunologic: No itchy/runny eyes, nasal congestion, recent allergic reactions, rashes ? ?ALLERGIES: ?Allergies  ?Allergen Reactions  ? Penicillins Other (See Comments)  ?  Per pt. His mother has always told him that he was allergic to PCN , but he is not sure of what type of reaction he had   ? ? ?HOME MEDICATIONS: ? ?Current Outpatient Medications:  ?  Siponimod Fumarate (MAYZENT) 1 MG TABS, Take 1 mg by mouth daily., Disp: , Rfl:  ? ?PAST MEDICAL HISTORY: ?No past medical history on file. ? ?PAST SURGICAL HISTORY: ?Past Surgical History:  ?Procedure Laterality Date  ? right inguinal hernia repair Right   ? ? ?FAMILY HISTORY: ?No family history on file. ? ?SOCIAL HISTORY: ? ?Social History  ? ?Socioeconomic History  ? Marital status: Divorced  ?  Spouse name: Not on file  ? Number of children: Not on file  ? Years of education: Not on file  ? Highest education level: Not on file  ?Occupational History  ? Not on file  ?Tobacco Use  ? Smoking status: Never  ? Smokeless tobacco: Never  ?Substance and Sexual Activity  ? Alcohol use: Yes  ?  Alcohol/week: 0.0 standard drinks  ?  Comment: rare social  ? Drug use: Never  ? Sexual activity: Not on file  ?Other Topics Concern  ? Not on file  ?Social History Narrative  ? Not on file  ? ?Social Determinants of Health  ? ?Financial Resource Strain: Not on file  ?Food Insecurity: Not on file  ?Transportation Needs: Not on file  ?Physical Activity: Not on file  ?Stress: Not on file  ?Social Connections: Not on file  ?Intimate Partner Violence: Not on file  ? ? ? ?PHYSICAL EXAM ? ?Vitals:  ? 07/20/21 1258  ?BP: 121/89  ?Pulse: 94  ?Weight: 218 lb (98.9 kg)  ?Height: 5\' 6"  (1.676 m)  ? ? ?Body mass index is 35.19 kg/m?. ? ? ?General: The patient is well-developed and well-nourished and in no acute distress ? ?HEENT:  Head is [place]/AT.  Sclera are anicteric. ? ?Skin: Extremities are without rash or  edema. ? ?Musculoskeletal:  Back is  nontender ? ?Neurologic Exam ? ?Mental status: The patient is alert and oriented x 3 at the time of the examination. The patient has apparent normal recent and remote memory, with an apparently normal attention span and concentration ability.   Speech is normal. ? ?Cranial nerves: Extraocular movements are full. Facial strength and sensation are normal  He has hearing aids ? ?Motor:  Muscle bulk is normal.   Tone is normal. Strength is  5 / 5 in all 4 extremities.  ? ?Sensory: Sensory testing is intact to touch and vibration now. ? ?Coordination: Cerebellar testing reveals good finger-nose-finger and heel-to-shin  bilaterally. ? ?Gait and station: Station is normal.   Gait is normal. Tandem gait is normal. Romberg is negative.  ? ?Reflexes: Deep tendon reflexes are symmetric and normal bilaterally.    ? ? ? ? ?DIAGNOSTIC DATA (LABS, IMAGING, TESTING) ?- I reviewed patient records, labs, notes, testing and imaging myself where available. ? ?Lab Results  ?Component Value Date  ? WBC 11.6 (H) 09/12/2018  ? HGB 15.8 09/12/2018  ? HCT 48.3 09/12/2018  ? MCV 87.7 09/12/2018  ? PLT 221 09/12/2018  ? ?   ?Component Value Date/Time  ? NA 137 09/12/2018 0838  ? K 3.5 09/12/2018 0838  ? CL 102 09/12/2018 0838  ? CO2 24 09/12/2018 0838  ? GLUCOSE 138 (H) 09/12/2018 1610  ? BUN 23 (H) 09/12/2018 9604  ? CREATININE 1.04 09/12/2018 0838  ? CALCIUM 9.3 09/12/2018 0838  ? PROT 7.2 09/12/2018 0838  ? ALBUMIN 4.3 09/12/2018 0838  ? AST 22 09/12/2018 0838  ? ALT 22 09/12/2018 0838  ? ALKPHOS 51 09/12/2018 0838  ? BILITOT 1.1 09/12/2018 0838  ? GFRNONAA >60 09/12/2018 5409  ? GFRAA >60 09/12/2018 0838  ? ? ? ? ? ?ASSESSMENT AND PLAN ? ?Multiple sclerosis ([facility]) ? ?High risk medication use ? ?Dysesthesia ? ?Lhermitte's sign positive ? ?Decreased hearing of both ears ? ? ?Continue Mayzent.  Check labs.    Check MRI of the brain at next visit (he has a high deductible plan and would prefer to wait).   Stay active and exercise as  tolerated. ?If dysesthesias worsen we could consider gabapentin. ?We discussed trying to be more active and lose weight if possible.   If cognitive issues worsen, will check PSG.    ?He will return to see [place] in 6 months or sooner if t

## 2021-07-21 ENCOUNTER — Ambulatory Visit: Payer: BC Managed Care – PPO | Admitting: Neurology

## 2021-07-21 LAB — CBC WITH DIFFERENTIAL/PLATELET
Basophils Absolute: 0 10*3/uL (ref 0.0–0.2)
Basos: 0 %
EOS (ABSOLUTE): 0.1 10*3/uL (ref 0.0–0.4)
Eos: 3 %
Hematocrit: 48.2 % (ref 37.5–51.0)
Hemoglobin: 16.2 g/dL (ref 13.0–17.7)
Immature Grans (Abs): 0.1 10*3/uL (ref 0.0–0.1)
Immature Granulocytes: 2 %
Lymphocytes Absolute: 0.5 10*3/uL — ABNORMAL LOW (ref 0.7–3.1)
Lymphs: 12 %
MCH: 28.8 pg (ref 26.6–33.0)
MCHC: 33.6 g/dL (ref 31.5–35.7)
MCV: 86 fL (ref 79–97)
Monocytes Absolute: 0.6 10*3/uL (ref 0.1–0.9)
Monocytes: 13 %
Neutrophils Absolute: 3 10*3/uL (ref 1.4–7.0)
Neutrophils: 70 %
Platelets: 243 10*3/uL (ref 150–450)
RBC: 5.63 x10E6/uL (ref 4.14–5.80)
RDW: 13.1 % (ref 11.6–15.4)
WBC: 4.2 10*3/uL (ref 3.4–10.8)

## 2021-07-21 LAB — HEPATIC FUNCTION PANEL
ALT: 25 IU/L (ref 0–44)
AST: 14 IU/L (ref 0–40)
Albumin: 4.4 g/dL (ref 4.0–5.0)
Alkaline Phosphatase: 62 IU/L (ref 44–121)
Bilirubin Total: 0.4 mg/dL (ref 0.0–1.2)
Bilirubin, Direct: 0.12 mg/dL (ref 0.00–0.40)
Total Protein: 6.5 g/dL (ref 6.0–8.5)

## 2021-09-26 ENCOUNTER — Telehealth: Payer: Self-pay | Admitting: Neurology

## 2021-09-26 NOTE — Telephone Encounter (Signed)
Tiffany Pharmacy Tech from [facility] called and LVM wanting to speak to the RN regarding a possible gap in Mayzant therapy. Please advise.

## 2021-09-26 NOTE — Telephone Encounter (Signed)
Called pt back. He has not had a gap in Mayzent therapy. He had an extra bottle. Does not need refill. Should get next shipment 09/28/21.

## 2021-09-30 ENCOUNTER — Telehealth: Payer: Self-pay | Admitting: Neurology

## 2021-09-30 NOTE — Telephone Encounter (Signed)
Heba from [facility] called needing to speak to the RN regarding he pt's gap in therapy. Please call [phone]

## 2021-10-26 ENCOUNTER — Telehealth: Payer: Self-pay | Admitting: Neurology

## 2021-10-26 NOTE — Telephone Encounter (Signed)
PA submitted through [facility]/ [employer] [id number] Will await determination

## 2021-10-31 NOTE — Telephone Encounter (Signed)
Received fax from [employer] that PA approved 11/05/21-11/06/22. Case: [id number].

## 2021-11-17 ENCOUNTER — Telehealth: Payer: Self-pay | Admitting: Neurology

## 2021-11-17 DIAGNOSIS — G35 Multiple sclerosis: Secondary | ICD-10-CM

## 2021-11-17 MED ORDER — MAYZENT 1 MG PO TABS: 1.0000 mg | ORAL_TABLET | Freq: Every day | ORAL | 5 refills | Status: DC

## 2021-11-17 NOTE — Telephone Encounter (Signed)
Pt request refill for Siponimod Fumarate (MAYZENT) 1 MG TABS at [facility]

## 2021-11-17 NOTE — Telephone Encounter (Signed)
Called pt. He was last seen 07/20/21. Has no f/u scheduled. Scheduled f/u for 02/13/22 at 1:00pm with Dr. Epimenio Foot. E-scribed refill to [facility].

## 2021-11-24 ENCOUNTER — Telehealth: Payer: Self-pay | Admitting: Neurology

## 2021-11-24 NOTE — Telephone Encounter (Signed)
Pt is calling and said he have a question about medication MAYZENT 1 MG TABS .

## 2021-11-24 NOTE — Telephone Encounter (Signed)
Pt also wanted to know if he could take Mayzent and lexapro together. He sts in the past  he was rx'd lexapro 5 mg daily to help with job related stress. He is planning on talking with his PCP on a prescription for this but wanted to verify if it was safe? Also asked if this med is not safe if Dr. Epimenio Foot could recommend another med and he would discuss with PCP.    I advised I would send to MD and we would call the first part of the week with recommendation.

## 2021-11-24 NOTE — Telephone Encounter (Signed)
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 I advised co pay assistance is available but we cannot guarantee the cost until he was to apply. Pt verbalized understanding on this.

## 2021-11-28 NOTE — Telephone Encounter (Signed)
Theoretically, Mayzent and Lexapro combination could slow electrical conduction in the heart.  In the same family as Lexapro, Paxil would be similar but safer.   It he just has anxiety and not depression, then BuSpar would be the safest.   I called pt back and relayed the message from Dr. Epimenio Foot. He verbalized understanding. Will discuss further with PCP.

## 2022-02-09 ENCOUNTER — Encounter: Payer: Self-pay | Admitting: *Deleted

## 2022-02-13 ENCOUNTER — Ambulatory Visit: Payer: BC Managed Care – PPO | Admitting: Neurology

## 2022-03-15 ENCOUNTER — Telehealth: Payer: Self-pay | Admitting: Neurology

## 2022-03-15 DIAGNOSIS — G35 Multiple sclerosis: Secondary | ICD-10-CM

## 2022-03-15 MED ORDER — MAYZENT 1 MG PO TABS: 1.0000 mg | ORAL_TABLET | Freq: Every day | ORAL | 0 refills | Status: DC

## 2022-03-15 NOTE — Telephone Encounter (Signed)
Pt called back. Requesting a call back from nurse. Stated he needs to give nurse some phone numbers and update on medication.

## 2022-03-15 NOTE — Telephone Encounter (Signed)
Called pt back. States [facility] is going to provide 30 days supply until he can get set up with [employer] patient assistance. They needed rx sent. I e-scribed rx to [facility].  Also faxed back completed/signed [employer] prescriber portion of form to [phone]. Received fax confirmation.

## 2022-03-15 NOTE — Addendum Note (Signed)
Addended by: Arther Abbott on: 03/15/2022 09:46 AM   Modules accepted: Orders

## 2022-03-15 NOTE — Telephone Encounter (Signed)
Called Mayzent support program at [phone]. Spoke w/ Mariana Kaufman. States pt should call and update them about insurance change. They will direct him to [employer] patient assistance foundation to apply.   Called pt back at [phone]. Relayed above info. He will call. Aware we will fill out prescriber portion of application for him and send in. He will need to send in his portion once complete. He verbalized understanding.

## 2022-03-15 NOTE — Telephone Encounter (Signed)
Pt states he will not have insurance again until Feb '24 he is asking if he is able to get a supply of the MAYZENT so he doesn't have to titrate, he has a little but doesn't want to run out, please call.

## 2022-04-06 NOTE — Telephone Encounter (Signed)
Pt states he is down to 4 pills , he is asking that [facility] be called at [phone] and request the medication be over nighted.

## 2022-04-06 NOTE — Telephone Encounter (Signed)
Called [facility] and spoke with [place] who states they do have the script but until they get the approval from [employer] they are unable to fill the medication for the pt.  She provided me with the phone number which was the [facility] support program #. [phone]. She states once they get the green light from [facility] they overnight the script to the patient.  Called Mayzent and spoke with [facility] who is able to see the request on her end. She will escalate this to be filled for the patient.   12/18 he was enrolled into the N path program. It is still pending, they were requesting proof of income. She states they will call the patient and get that information as well as give green light for [facility] to fill script on file.

## 2022-04-24 ENCOUNTER — Other Ambulatory Visit: Payer: Self-pay | Admitting: *Deleted

## 2022-04-24 DIAGNOSIS — G35 Multiple sclerosis: Secondary | ICD-10-CM

## 2022-04-24 MED ORDER — MAYZENT 1 MG PO TABS: 1.0000 mg | ORAL_TABLET | Freq: Every day | ORAL | 0 refills | Status: DC

## 2022-05-16 ENCOUNTER — Encounter: Payer: Self-pay | Admitting: Neurology

## 2022-05-16 ENCOUNTER — Ambulatory Visit: Payer: 59 | Admitting: Neurology

## 2022-05-16 VITALS — BP 120/84 | HR 85 | Ht 66.0 in | Wt 227.0 lb

## 2022-05-16 DIAGNOSIS — G35 Multiple sclerosis: Secondary | ICD-10-CM | POA: Diagnosis not present

## 2022-05-16 DIAGNOSIS — R208 Other disturbances of skin sensation: Secondary | ICD-10-CM | POA: Diagnosis not present

## 2022-05-16 DIAGNOSIS — Z79899 Other long term (current) drug therapy: Secondary | ICD-10-CM | POA: Diagnosis not present

## 2022-05-16 DIAGNOSIS — E559 Vitamin D deficiency, unspecified: Secondary | ICD-10-CM

## 2022-05-16 NOTE — Progress Notes (Signed)
[facility]  PATIENT: Alexes Lamarque DOB: 1980/11/17  REFERRING DOCTOR OR PCP: Aggie Hacker, PA SOURCE: Patient, notes from primary care, notes from [facility] neurology, MRI and lab reports, MRI images personally reviewed.  _________________________________   HISTORICAL  CHIEF COMPLAINT:  Chief Complaint  Patient presents with   Room 11    Pt is here Alone.Pt states that things have been going good. Pt states that medication is working.     HISTORY OF PRESENT ILLNESS:  Abou Sterkel is a 42 y.o. man with multiple sclerosis.  Update 05/16/2022: He is on Mayzent.  .  He had fatigue the first few days so after the first week he began to take it at bedtime.  He tolerates that better.  No new neurologic symptoms since the last time I saw him.    No exacerbation.    His gait is usually good though he has some stumbles.   No falls.   He has no trouble going down stairs..   Strength is fine.      He has a Lhermitte sign but this is stable.   He has mild numbness in his toes but this is not painful..   He has occasional urinary and bowel hesitancy.    Vision is fine.   He has reduced hearing and wears hearing aids.    Sometimes discriminating words can be more difficult.    He denies much fatigue. He is more likely to get tired when working outdoors or going to a [employer] with wife.   He feels his memory is worse than before the MS diagnosis.  He has trouble remembering numbers    He notes some word finding issues, especially with names of people and things.    Denies depression has mild anxiety but unchanged.   Sleep is variable due to his job.  He snores some but has never been told he has pauses, snorts or gasps at night.       MS HISTORY Mr. Hulsebus was diagnosed with MS March 2022.   In 2014 he had numbness in his right arm and he had an MRI of the cervical spine and was told he had a spot in his spinal cord but MS was not mentioned and he had no follow up.   In  November 2021, he had bilateral numbness in his feet and legs.   This improved, incompletely, over a few months.    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   He experienced numbness form the lower abdomen and down in March 2022 and had a tight sensation in the groin.   Symptom were worse in hot water.    He saw [facility] March 2022.    He had an MRi of the brain and cervical spine which showed changes c/w MS.  He had 3 days of IV Solu-medrol and numbness improved but he continued to have a sensation in his feet like there is a balled up sock.   Imaging personally reviewed today: MRI brain 06/26/2020 shows T2/FLAIR hyperintense foci in the right pons near the dorsal root entry zone of the trigeminal nerve, left midbrain and in the periventricular, juxtacortical and deep white matter of both hemispheres.  One small focus in the juxtacortical white matter of the right parieto-occipital lobe had subtle enhancement.  MRI cervical spine 06/26/2020 showed T2 hyperintense foci just below the cervicomedullary junction, in the left posterolateral spinal cord  adjacent to C1-C2, and the right posterolateral spinal cord adjacent to C5.  Mild degenerative changes at C5-C6 and C6-C7 not cord and spinal stenosis or nerve root compression.   REVIEW OF SYSTEMS: Constitutional: No fevers, chills, sweats, or change in appetite Eyes: No visual changes, double vision, eye pain Ear, nose and throat: No hearing loss, ear pain, nasal congestion, sore throat Cardiovascular: No chest pain, palpitations Respiratory:  No shortness of breath at rest or with exertion.   No wheezes GastrointestinaI: No nausea, vomiting, diarrhea, abdominal pain, fecal incontinence Genitourinary:  No dysuria, urinary retention or frequency.  No nocturia. Musculoskeletal:  No neck pain, back pain Integumentary: No rash, pruritus, skin lesions Neurological: as above Psychiatric: No  depression at this time.  No anxiety Endocrine: No palpitations, diaphoresis, change in appetite, change in weigh or increased thirst Hematologic/Lymphatic:  No anemia, purpura, petechiae. Allergic/Immunologic: No itchy/runny eyes, nasal congestion, recent allergic reactions, rashes  ALLERGIES: Allergies  Allergen Reactions   Penicillins Other (See Comments)    Per pt. His mother has always told him that he was allergic to PCN , but he is not sure of what type of reaction he had     HOME MEDICATIONS:  Current Outpatient Medications:    MAYZENT 1 MG TABS, Take 1 mg by mouth daily., Disp: 30 tablet, Rfl: 0  PAST MEDICAL HISTORY: History reviewed. No pertinent past medical history.  PAST SURGICAL HISTORY: Past Surgical History:  Procedure Laterality Date   right inguinal hernia repair Right     FAMILY HISTORY: History reviewed. No pertinent family history.  SOCIAL HISTORY:  Social History   Socioeconomic History   Marital status: Divorced    Spouse name: Not on file   Number of children: Not on file   Years of education: Not on file   Highest education level: Not on file  Occupational History   Not on file  Tobacco Use   Smoking status: Never   Smokeless tobacco: Never  Substance and Sexual Activity   Alcohol use: Yes    Alcohol/week: 0.0 standard drinks of alcohol    Comment: rare social   Drug use: Never   Sexual activity: Not on file  Other Topics Concern   Not on file  Social History Narrative   Not on file   Social Determinants of Health   Financial Resource Strain: Not on file  Food Insecurity: Not on file  Transportation Needs: Not on file  Physical Activity: Not on file  Stress: Not on file  Social Connections: Not on file  Intimate Partner Violence: Not on file     PHYSICAL EXAM  Vitals:   05/16/22 1319  BP: 120/84  Pulse: 85  Weight: 227 lb (103 kg)  Height: 5\' 6"  (1.676 m)    Body mass index is 36.64 kg/m.   General: The patient  is well-developed and well-nourished and in no acute distress  HEENT:  Head is [place]/AT.  Sclera are anicteric.  Skin: Extremities are without rash or  edema.  Musculoskeletal:  Back is nontender  Neurologic Exam  Mental status: The patient is alert and oriented x 3 at the time of the examination. The patient has apparent normal recent and remote memory, with an apparently normal attention span and concentration ability.   Speech is normal.  Cranial nerves: Extraocular movements are full. Facial strength and sensation are normal  He has hearing aids  Motor:  Muscle bulk is normal.   Tone is normal. Strength is  5 /  5 in all 4 extremities.   Sensory: Sensory testing is intact to touch and vibration now.  Coordination: Cerebellar testing reveals good finger-nose-finger and heel-to-shin bilaterally.  Gait and station: Station is normal.   Gait is normal. Tandem gait is normal. Romberg is negative.   Reflexes: Deep tendon reflexes are symmetric and normal bilaterally.        DIAGNOSTIC DATA (LABS, IMAGING, TESTING) - I reviewed patient records, labs, notes, testing and imaging myself where available.  Lab Results  Component Value Date   WBC 4.2 07/20/2021   HGB 16.2 07/20/2021   HCT 48.2 07/20/2021   MCV 86 07/20/2021   PLT 243 07/20/2021      Component Value Date/Time   NA 137 09/12/2018 0838   K 3.5 09/12/2018 0838   CL 102 09/12/2018 0838   CO2 24 09/12/2018 0838   GLUCOSE 138 (H) 09/12/2018 0838   BUN 23 (H) 09/12/2018 0838   CREATININE 1.04 09/12/2018 0838   CALCIUM 9.3 09/12/2018 0838   PROT 6.5 07/20/2021 1338   ALBUMIN 4.4 07/20/2021 1338   AST 14 07/20/2021 1338   ALT 25 07/20/2021 1338   ALKPHOS 62 07/20/2021 1338   BILITOT 0.4 07/20/2021 1338   GFRNONAA >60 09/12/2018 0838   GFRAA >60 09/12/2018 0838       ASSESSMENT AND PLAN  Multiple sclerosis ([place]) - Plan: CBC with Differential/Platelet, Comprehensive metabolic panel, MR BRAIN W WO CONTRAST, MR  CERVICAL SPINE W WO CONTRAST  Dysesthesia - Plan: MR BRAIN W WO CONTRAST, MR CERVICAL SPINE W WO CONTRAST  High risk medication use - Plan: CBC with Differential/Platelet, Comprehensive metabolic panel  Vitamin D deficiency - Plan: VITAMIN D 25 Hydroxy (Vit-D Deficiency, Fractures)   Continue Mayzent.  Check labs.    Check MRI of the brain and cerv spine to determine if subclinical progression .  If occurring consider natalizumab or a anti-CD20 agent.   If dysesthesias worsen we could consider gabapentin. We discussed trying to be more active and lose weight if possible.   Eat well.    If cognitive issues worsen, will check PSG.    He will return to see [place] in 6 months or sooner if there are new or worsening neurologic symptoms.   Lynnwood Beckford A. Felecia Shelling, MD, Gifford Shave 09/10/9474, 5:46 PM Certified in Neurology, Clinical Neurophysiology, Sleep Medicine and Neuroimaging  [facility] [address] [place], [place] [address] [phone]

## 2022-05-17 LAB — CBC WITH DIFFERENTIAL/PLATELET
Basophils Absolute: 0 10*3/uL (ref 0.0–0.2)
Basos: 1 %
EOS (ABSOLUTE): 0.1 10*3/uL (ref 0.0–0.4)
Eos: 3 %
Hematocrit: 45.5 % (ref 37.5–51.0)
Hemoglobin: 15.5 g/dL (ref 13.0–17.7)
Immature Grans (Abs): 0 10*3/uL (ref 0.0–0.1)
Immature Granulocytes: 1 %
Lymphocytes Absolute: 0.3 10*3/uL — ABNORMAL LOW (ref 0.7–3.1)
Lymphs: 7 %
MCH: 29 pg (ref 26.6–33.0)
MCHC: 34.1 g/dL (ref 31.5–35.7)
MCV: 85 fL (ref 79–97)
Monocytes Absolute: 0.6 10*3/uL (ref 0.1–0.9)
Monocytes: 13 %
Neutrophils Absolute: 3.4 10*3/uL (ref 1.4–7.0)
Neutrophils: 75 %
Platelets: 224 10*3/uL (ref 150–450)
RBC: 5.34 x10E6/uL (ref 4.14–5.80)
RDW: 13 % (ref 11.6–15.4)
WBC: 4.4 10*3/uL (ref 3.4–10.8)

## 2022-05-17 LAB — VITAMIN D 25 HYDROXY (VIT D DEFICIENCY, FRACTURES): Vit D, 25-Hydroxy: 26 ng/mL — ABNORMAL LOW (ref 30.0–100.0)

## 2022-05-17 LAB — COMPREHENSIVE METABOLIC PANEL
ALT: 28 IU/L (ref 0–44)
AST: 14 IU/L (ref 0–40)
Albumin/Globulin Ratio: 2.1 (ref 1.2–2.2)
Albumin: 4.2 g/dL (ref 4.1–5.1)
Alkaline Phosphatase: 58 IU/L (ref 44–121)
BUN/Creatinine Ratio: 14 (ref 9–20)
BUN: 13 mg/dL (ref 6–24)
Bilirubin Total: 0.5 mg/dL (ref 0.0–1.2)
CO2: 25 mmol/L (ref 20–29)
Calcium: 9.3 mg/dL (ref 8.7–10.2)
Chloride: 102 mmol/L (ref 96–106)
Creatinine, Ser: 0.91 mg/dL (ref 0.76–1.27)
Globulin, Total: 2 g/dL (ref 1.5–4.5)
Glucose: 89 mg/dL (ref 70–99)
Potassium: 3.8 mmol/L (ref 3.5–5.2)
Sodium: 141 mmol/L (ref 134–144)
Total Protein: 6.2 g/dL (ref 6.0–8.5)
eGFR: 108 mL/min/{1.73_m2} (ref >59)

## 2022-05-18 ENCOUNTER — Telehealth: Payer: Self-pay | Admitting: Neurology

## 2022-05-18 NOTE — Telephone Encounter (Signed)
Cervical [employer] Josem Kaufmann: [id number] exp. 05/18/22-07/02/22  Brain [employer] auth: [id number] exp/ 05/18/22-07/02/22 Sent to GI [phone]

## 2022-06-06 ENCOUNTER — Telehealth: Payer: Self-pay | Admitting: Neurology

## 2022-06-06 DIAGNOSIS — G35 Multiple sclerosis: Secondary | ICD-10-CM

## 2022-06-06 MED ORDER — MAYZENT 1 MG PO TABS: 1.0000 mg | ORAL_TABLET | Freq: Every day | ORAL | 1 refills | Status: DC

## 2022-06-06 NOTE — Telephone Encounter (Signed)
Rx sent to pt requested pharmacy.

## 2022-06-06 NOTE — Telephone Encounter (Signed)
Pt is calling requesting a refill on MAYZENT 1 MG TABS. Refill should be sent to [facility]. Pt said he is almost out of medication.

## 2022-06-07 MED ORDER — MAYZENT 1 MG PO TABS: 1.0000 mg | ORAL_TABLET | Freq: Every day | ORAL | 11 refills | Status: DC

## 2022-06-07 NOTE — Telephone Encounter (Signed)
Pt states he will be out of town and can not wait for the 3 days that [facility] takes if Rx is requested by email. He states that it will need it to be called in verbally and overnight it to him due to him leaving on Saturday morning.

## 2022-06-07 NOTE — Telephone Encounter (Signed)
Called [facility] at [phone]. Spoke w/ Margarita Grizzle. They filled rx Mayzent last 03/2022. He then changed to [facility] PAF to fill rx via that pharmacy.    [facility] [employer], [place] Phone: [phone]  Fax: [phone]     Called this pharmacy above. Spoke w/ Edwena Blow. She transferred me. Spoke w/ Lelon Frohlich. She transferred me to pharmacist, Clair Gulling. Can only do 30 days supply. Provided VO for #30, 11 refills. Asked them to expedite shipment. They marked rx STAT. They will call pt to process rx once ready to set up shipment.   Please call pt and let him know I called pharmacy and provided verbal order for prescription and they are going to expedite for him. They will be calling him. He should be on look out for phone call from them.

## 2022-06-12 NOTE — Telephone Encounter (Signed)
Called pt and phone is out of service at the time.

## 2022-06-12 NOTE — Telephone Encounter (Signed)
Can you try calling mother/sister on DPR?

## 2022-06-13 NOTE — Telephone Encounter (Signed)
Spoke to his sister Vikki Ports and she states that the pt is in [place] right now but has not heard that he has not received his medication but will bring it up the next time she speaks to him.

## 2022-06-13 NOTE — Telephone Encounter (Signed)
Called pt's mother and number is out of service, called his sister Vikki Ports and LVM for her to call the office back.

## 2022-06-13 NOTE — Telephone Encounter (Signed)
Noted  

## 2022-06-20 ENCOUNTER — Other Ambulatory Visit: Payer: Self-pay

## 2022-06-29 ENCOUNTER — Telehealth: Payer: Self-pay | Admitting: Neurology

## 2022-06-29 ENCOUNTER — Other Ambulatory Visit: Payer: Self-pay

## 2022-06-29 ENCOUNTER — Other Ambulatory Visit: Payer: Self-pay | Admitting: Neurology

## 2022-06-29 MED ORDER — PREDNISONE 50 MG PO TABS: ORAL_TABLET | ORAL | 0 refills | Status: DC

## 2022-06-29 NOTE — Telephone Encounter (Signed)
Pt believes he may be having a MS flare up due to unusual pain/feeling in right foot.  Pt asking for Prednisone to be called into  [facility] #[id number]

## 2022-07-03 NOTE — Telephone Encounter (Signed)
Called pt to let him know that Dr. Felecia Shelling sent in a script for prednisone and for him to call us if his symptoms hasn't gotten any better.

## 2022-07-17 ENCOUNTER — Other Ambulatory Visit: Payer: Self-pay

## 2022-11-01 ENCOUNTER — Telehealth: Payer: Self-pay | Admitting: *Deleted

## 2022-11-01 ENCOUNTER — Other Ambulatory Visit (HOSPITAL_COMMUNITY): Payer: Self-pay

## 2022-11-01 NOTE — Telephone Encounter (Signed)
Cover my meds sent fax stating PA needed for  Mayzent 1 mg tablets

## 2022-11-02 ENCOUNTER — Other Ambulatory Visit (HOSPITAL_COMMUNITY): Payer: Self-pay

## 2022-11-02 ENCOUNTER — Telehealth: Payer: Self-pay

## 2022-11-02 NOTE — Telephone Encounter (Signed)
Pharmacy Patient Advocate Encounter   Received notification from Patient Pharmacy that prior authorization for Mayzent 1 MG Tablets is required/requested.   Insurance verification completed.   The patient is insured through  [employer] [place]  .   Per test claim: PA required; PA submitted to [employer] (Per test claim) via Fax Key/confirmation #/EOC N/A Status is pending

## 2022-11-02 NOTE — Telephone Encounter (Signed)
PA request has been Submitted. New Encounter created for follow up. For additional info see Pharmacy Prior Auth telephone encounter from 11/02/2022.

## 2022-11-06 NOTE — Telephone Encounter (Signed)
   Received a request via fax for additional information-I could not see in the chart where the MRI has been done or the scheduled date to have it done-Please advise.

## 2022-11-06 NOTE — Telephone Encounter (Signed)
I called pt.  He states that the [facility] was going to charge a [employer], but also going to cost $1200 for MRI's.  Is there anyplace any cheaper.  I gave him MS society to call and check out if qualifies.  I would touch base with [facility] coor if other ceneter may be cheaper.  He appreciated this.  He has been taking the mayzent since 04/2020 on daily basis.

## 2022-11-08 NOTE — Telephone Encounter (Signed)
I wrote a letter to respond to the request for additional information and faxed that along with clinicals to [phone].

## 2022-11-10 NOTE — Telephone Encounter (Signed)
Received a fax requesting CYP2C9 Genotype results-Faxed results to [phone]. Awaiting determination.

## 2022-11-16 ENCOUNTER — Ambulatory Visit: Payer: BC Managed Care – PPO | Admitting: Neurology

## 2022-11-27 NOTE — Telephone Encounter (Signed)
Pharmacy Patient Advocate Encounter  [place]  that [place] for Mayzent has been DENIED. Please advise how you'd like to proceed. Full denial letter will be uploaded to the media tab. See denial reason below.   *Denial letter attached in patients media.

## 2023-03-07 ENCOUNTER — Encounter: Payer: Self-pay | Admitting: *Deleted

## 2023-03-14 NOTE — Telephone Encounter (Signed)
Called pt. Relayed unread [employer]. He verbalized understanding. He also received something in the mail. I scheduled follow up for 03/21/23 at 9am w/ Dr. Epimenio Foot. He will bring his completed portion of application that day.

## 2023-03-21 ENCOUNTER — Encounter: Payer: Self-pay | Admitting: Neurology

## 2023-03-21 ENCOUNTER — Ambulatory Visit: Payer: 59 | Admitting: Neurology

## 2023-03-21 ENCOUNTER — Telehealth: Payer: Self-pay | Admitting: Neurology

## 2023-03-21 VITALS — BP 116/79 | HR 87 | Ht 66.0 in | Wt 206.0 lb

## 2023-03-21 DIAGNOSIS — Z79899 Other long term (current) drug therapy: Secondary | ICD-10-CM

## 2023-03-21 DIAGNOSIS — R29818 Other symptoms and signs involving the nervous system: Secondary | ICD-10-CM

## 2023-03-21 DIAGNOSIS — G35 Multiple sclerosis: Secondary | ICD-10-CM

## 2023-03-21 DIAGNOSIS — R208 Other disturbances of skin sensation: Secondary | ICD-10-CM | POA: Diagnosis not present

## 2023-03-21 DIAGNOSIS — E559 Vitamin D deficiency, unspecified: Secondary | ICD-10-CM | POA: Diagnosis not present

## 2023-03-21 DIAGNOSIS — H9193 Unspecified hearing loss, bilateral: Secondary | ICD-10-CM

## 2023-03-21 MED ORDER — DULOXETINE HCL 60 MG PO CPEP: 60.0000 mg | ORAL_CAPSULE | Freq: Every day | ORAL | 3 refills | Status: DC

## 2023-03-21 NOTE — Progress Notes (Signed)
[facility]  PATIENT: Randall Butler DOB: 08-31-1980  REFERRING DOCTOR OR PCP: Daria Pastures, PA SOURCE: Patient, notes from primary care, notes from [facility] neurology, MRI and lab reports, MRI images personally reviewed.  _________________________________   HISTORICAL  CHIEF COMPLAINT:  Chief Complaint  Patient presents with   Follow-up    RM 11, alone. Last seen 05/16/22. MS DMT: Mayzent 1mg  po every day. Feels short term memory worse.Randall Butler forgot Randall Butler portion of application for Mayzent pt assistance renewal. Randall Butler will either fax or email back to me. Randall Butler also could not afford MRI (was going to be 1200). I gave Randall Butler application to [employer] MRI access program. Randall Butler will look into this.     HISTORY OF PRESENT ILLNESS:  Randall Butler is a 42 y.o. man with multiple sclerosis.  Update 03/21/2023: Randall Butler is on Mayzent.  Randall Butler is tolerating it well.   No new neurologic symptoms since the last time I saw Randall Butler.    No exacerbation.    We had ordered MRI after the last visit but Randall Butler did not get this done due to cost.  Randall Butler will fill out the [employer] form.   Randall Butler gait is good.  Occasional stumbles but no falls.   Randall Butler has no trouble going down stairs..   Strength is fine.      Randall Butler feels a little dizzy looking up.     Randall Butler has mild numbness in Randall Butler toes but this is not painful..   Randall Butler has occasional urinary and bowel hesitancy.  No incontinence.      Vision is fine.   Randall Butler has reduced hearing and wears hearing aids.    Sometimes discriminating words can be more difficult.  Does not prevent Randall Butler from working  Randall Butler denies much fatigue.  Randall Butler is more likely to get tired when working outdoors or going to a [employer] with wife.     Randall Butler feels Randall Butler memory is worse than before the MS diagnosis.  Randall Butler has trouble remembering numbers    Randall Butler notes some word finding issues, especially with names of people and things.    Denies depression has mild anxiety but unchanged.   Randall Butler feels Randall Butler sometimes says tings before Randall Butler thinks and  is a little more emotional.   Sleep is usually good but sometimes wakes up ad has trouble falling back asleep.  Randall Butler snores some but has never been told Randall Butler has pauses, snorts or gasps at night.    Randall Butler will get headaches with migrainous aura and features.    Randall Butler used to get nausea but now usually does not.   However, lights and noise bothers Randall Butler and moving worsens the pain.  Randall Butler takes ibuprofen or acetaminophen.   HA yesterday wa worse and Randall Butler took a Goody powder.   Randall Butler gets these rarely just a couple/year.      Randall Butler takes Vit D (and B12, Mg)   MS HISTORY Randall Butler was diagnosed with MS March 2022.   In 2014 Randall Butler had numbness in Randall Butler right arm and Randall Butler had an MRI of the cervical spine and was told Randall Butler had a spot in Randall Butler spinal cord but MS was not mentioned and Randall Butler had no follow up.   In November 2021, Randall Butler had bilateral numbness in Randall Butler feet and legs.   This improved, incompletely, over a few months.    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   Randall Butler experienced numbness  form the lower abdomen and down in March 2022 and had a tight sensation in the groin.   Symptom were worse in hot water.    Randall Butler saw [facility] March 2022.    Randall Butler had an MRi of the brain and cervical spine which showed changes c/w MS.  Randall Butler had 3 days of IV Solu-medrol and numbness improved but Randall Butler continued to have a sensation in Randall Butler feet like there is a balled up sock.   Imaging personally reviewed today: MRI brain 06/26/2020 shows T2/FLAIR hyperintense foci in the right pons near the dorsal root entry zone of the trigeminal nerve, left midbrain and in the periventricular, juxtacortical and deep white matter of both hemispheres.  One small focus in the juxtacortical white matter of the right parieto-occipital lobe had subtle enhancement.  MRI cervical spine 06/26/2020 showed T2 hyperintense foci just below the cervicomedullary junction, in the left posterolateral spinal cord adjacent to C1-C2, and the  right posterolateral spinal cord adjacent to C5.  Mild degenerative changes at C5-C6 and C6-C7 not cord and spinal stenosis or nerve root compression.   REVIEW OF SYSTEMS: Constitutional: No fevers, chills, sweats, or change in appetite Eyes: No visual changes, double vision, eye pain Ear, nose and throat: No hearing loss, ear pain, nasal congestion, sore throat Cardiovascular: No chest pain, palpitations Respiratory:  No shortness of breath at rest or with exertion.   No wheezes GastrointestinaI: No nausea, vomiting, diarrhea, abdominal pain, fecal incontinence Genitourinary:  No dysuria, urinary retention or frequency.  No nocturia. Musculoskeletal:  No neck pain, back pain Integumentary: No rash, pruritus, skin lesions Neurological: as above Psychiatric: No depression at this time.  No anxiety Endocrine: No palpitations, diaphoresis, change in appetite, change in weigh or increased thirst Hematologic/Lymphatic:  No anemia, purpura, petechiae. Allergic/Immunologic: No itchy/runny eyes, nasal congestion, recent allergic reactions, rashes  ALLERGIES: Allergies  Allergen Reactions   Penicillins Other (See Comments)    Per pt. Randall Butler mother has always told Randall Butler that Randall Butler was allergic to PCN , but Randall Butler is not sure of what type of reaction Randall Butler had     HOME MEDICATIONS:  Current Outpatient Medications:    MAYZENT 1 MG TABS, Take 1 mg by mouth daily., Disp: 30 tablet, Rfl: 11  PAST MEDICAL HISTORY: No past medical history on file.  PAST SURGICAL HISTORY: Past Surgical History:  Procedure Laterality Date   right inguinal hernia repair Right     FAMILY HISTORY: No family history on file.  SOCIAL HISTORY:  Social History   Socioeconomic History   Marital status: Divorced    Spouse name: Not on file   Number of children: Not on file   Years of education: Not on file   Highest education level: Not on file  Occupational History   Not on file  Tobacco Use   Smoking status: Never    Smokeless tobacco: Never  Substance and Sexual Activity   Alcohol use: Yes    Alcohol/week: 0.0 standard drinks of alcohol    Comment: rare social   Drug use: Never   Sexual activity: Not on file  Other Topics Concern   Not on file  Social History Narrative   Not on file   Social Determinants of Health   Financial Resource Strain: Not on file  Food Insecurity: Not on file  Transportation Needs: Not on file  Physical Activity: Not on file  Stress: Not on file  Social Connections: Unknown (08/21/2021)   Received from [facility], [facility]  Social Network    Social Network: Not on file  Intimate Partner Violence: Unknown (07/13/2021)   [facility] from [facility], [facility]   HITS    Physically Hurt: Not on file    Insult or Talk Down To: Not on file    Threaten Physical Harm: Not on file    Scream or Curse: Not on file     PHYSICAL EXAM  Vitals:   03/21/23 0832  BP: 116/79  Pulse: 87  Weight: 206 lb (93.4 kg)  Height: 5\' 6"  (1.676 m)    Body mass index is 33.25 kg/m.   General: The patient is well-developed and well-nourished and in no acute distress  HEENT:  Head is [place]/AT.  Sclera are anicteric.  Skin: No rash or  edema.  Musculoskeletal:  Back is nontender  Neurologic Exam  Mental status: The patient is alert and oriented x 3 at the time of the examination. The patient has apparent normal recent and remote memory, with an apparently normal attention span and concentration ability.   Speech is normal.  Cranial nerves: Extraocular movements are full. Facial strength and sensation are normal  Randall Butler has hearing aids  Motor:  Muscle bulk is normal.   Tone is normal. Strength is  5 / 5 in all 4 extremities.   Sensory: Sensory testing is intact to touch and vibration now.  Coordination: Cerebellar testing reveals good finger-nose-finger and heel-to-shin bilaterally.  Gait and station: Station is normal.   Gait is normal. Tandem is near normal.   Romberg is negative.   Reflexes: Deep tendon reflexes are symmetric and normal bilaterally.        DIAGNOSTIC DATA (LABS, IMAGING, TESTING) - I reviewed patient records, labs, notes, testing and imaging myself where available.  Lab Results  Component Value Date   WBC 4.4 05/16/2022   HGB 15.5 05/16/2022   HCT 45.5 05/16/2022   MCV 85 05/16/2022   PLT 224 05/16/2022      Component Value Date/Time   NA 141 05/16/2022 1408   K 3.8 05/16/2022 1408   CL 102 05/16/2022 1408   CO2 25 05/16/2022 1408   GLUCOSE 89 05/16/2022 1408   GLUCOSE 138 (H) 09/12/2018 0838   BUN 13 05/16/2022 1408   CREATININE 0.91 05/16/2022 1408   CALCIUM 9.3 05/16/2022 1408   PROT 6.2 05/16/2022 1408   ALBUMIN 4.2 05/16/2022 1408   AST 14 05/16/2022 1408   ALT 28 05/16/2022 1408   ALKPHOS 58 05/16/2022 1408   BILITOT 0.5 05/16/2022 1408   GFRNONAA >60 09/12/2018 0838   GFRAA >60 09/12/2018 0838       ASSESSMENT AND PLAN  Multiple sclerosis (HCC)  Dysesthesia  High risk medication use  Vitamin D deficiency  Lhermitte's sign positive  Decreased hearing of both ears   Continue Mayzent.  Check labs.    We will try to get MRI of the brain and cerv spine to determine if subclinical progression . Will fill out the [facility] application.    If occurring consider natalizumab or a anti-CD20 agent.   If dysesthesias worsen we could consider gabapentin. We discussed trying to be more active and lose weight if possible.   We discussed diet.    Randall Butler will return to see [place] in 6 months or sooner if there are new or worsening neurologic symptoms.   Morrell Fluke A. Epimenio Foot, MD, [facility] 03/21/2023, 9:12 AM Certified in Neurology, Clinical Neurophysiology, Sleep Medicine and Neuroimaging  [facility] [address]  [place], [place] [address] [phone]

## 2023-03-21 NOTE — Telephone Encounter (Signed)
Pt is calling with questions re: DULoxetine (CYMBALTA) 60 MG capsule

## 2023-03-21 NOTE — Telephone Encounter (Signed)
Took call from phone room and spoke w/ pt. He was wondering if Cymbalta is an antidepressant. I confirmed this. He was concerned about trying this. I went over concerns and encouraged he at least give it a try for a couple weeks. If he experiences any SE to give [place] a call. He verbalized understanding and appreciation.

## 2023-03-22 LAB — CBC WITH DIFFERENTIAL/PLATELET
Basophils Absolute: 0 10*3/uL (ref 0.0–0.2)
Basos: 1 %
EOS (ABSOLUTE): 0.1 10*3/uL (ref 0.0–0.4)
Eos: 3 %
Hematocrit: 51.3 % — ABNORMAL HIGH (ref 37.5–51.0)
Hemoglobin: 17.3 g/dL (ref 13.0–17.7)
Immature Grans (Abs): 0 10*3/uL (ref 0.0–0.1)
Immature Granulocytes: 1 %
Lymphocytes Absolute: 0.3 10*3/uL — ABNORMAL LOW (ref 0.7–3.1)
Lymphs: 8 %
MCH: 29.5 pg (ref 26.6–33.0)
MCHC: 33.7 g/dL (ref 31.5–35.7)
MCV: 87 fL (ref 79–97)
Monocytes Absolute: 0.5 10*3/uL (ref 0.1–0.9)
Monocytes: 12 %
Neutrophils Absolute: 3.1 10*3/uL (ref 1.4–7.0)
Neutrophils: 75 %
Platelets: 227 10*3/uL (ref 150–450)
RBC: 5.87 x10E6/uL — ABNORMAL HIGH (ref 4.14–5.80)
RDW: 13.4 % (ref 11.6–15.4)
WBC: 4 10*3/uL (ref 3.4–10.8)

## 2023-03-22 LAB — HEPATIC FUNCTION PANEL
ALT: 29 [IU]/L (ref 0–44)
AST: 15 [IU]/L (ref 0–40)
Albumin: 4.5 g/dL (ref 4.1–5.1)
Alkaline Phosphatase: 64 [IU]/L (ref 44–121)
Bilirubin Total: 0.7 mg/dL (ref 0.0–1.2)
Bilirubin, Direct: 0.17 mg/dL (ref 0.00–0.40)
Total Protein: 6.6 g/dL (ref 6.0–8.5)

## 2023-04-12 ENCOUNTER — Other Ambulatory Visit: Payer: Self-pay | Admitting: Neurology

## 2023-04-12 NOTE — Telephone Encounter (Signed)
 Last seen on 03/21/23 Follow up scheduled on 10/03/23

## 2023-04-18 ENCOUNTER — Encounter: Payer: Self-pay | Admitting: Neurology

## 2023-04-18 ENCOUNTER — Other Ambulatory Visit: Payer: Self-pay | Admitting: Neurology

## 2023-04-18 MED ORDER — DULOXETINE HCL 30 MG PO CPEP: 30.0000 mg | ORAL_CAPSULE | Freq: Every day | ORAL | 11 refills | Status: AC

## 2023-04-18 NOTE — Telephone Encounter (Signed)
 I called patient.  He reports he is only taken 1 duloxetine  60 mg capsule.  He felt weird after taking it and refuses to take anymore.  He would like to remain on an anti-anxiety medication.  He reports he was on Lexapro 5 mg years ago but he stopped taking that.  He is wondering if there is a lower dosage of Cymbalta  he could try instead.  He confirmed he is still using [facility] in [place].

## 2023-04-18 NOTE — Telephone Encounter (Signed)
 Pt states he feels DULoxetine (CYMBALTA) 60 MG capsule is too strong for him, makes him feel weird in his body. Would like to discuss maybe getting a lower dosage. Requesting call back

## 2023-04-24 ENCOUNTER — Telehealth: Payer: Self-pay | Admitting: *Deleted

## 2023-04-24 NOTE — Telephone Encounter (Signed)
 Called pt to check on status of MRI access form and [employer] PAF/Mayzent. He has completed applications and will bring to office today.

## 2023-05-01 ENCOUNTER — Telehealth: Payer: Self-pay | Admitting: *Deleted

## 2023-05-01 ENCOUNTER — Other Ambulatory Visit: Payer: Self-pay

## 2023-05-01 NOTE — Telephone Encounter (Signed)
Faxed completed/signed [employer] application for MRI assistance to [phone]. Received fax confirmation.

## 2023-05-01 NOTE — Telephone Encounter (Signed)
Called pt and asked him to call [employer] and give them his income information so that they can finish processing his Application. Pt stated that he doesn't have a Tax return to give information. Told pt to print off check stub with his YTD on it so they can at least get a picture of his income. Pt states that he will go to his job to print off his most recent check stud. Pt then states that he only has 1 pill left of his Mayzent medication and is sweating bullets  because he is about to be out of medication. Asked pt if he spoke to his pharmacy because he has 2 refills left that do expire until 05/2023, pt stated that [employer] told him that his script had expired due to his forms needing to be filled out and faxed to them.

## 2023-05-01 NOTE — Telephone Encounter (Signed)
Pt is asking for a call from Kara Mead, RN to discuss him being told he is unable to renew his  MAYZENT 1 MG TABS

## 2023-05-02 NOTE — Telephone Encounter (Signed)
Faxed off [employer] Form to [phone] on 05/01/2023.

## 2023-05-03 ENCOUNTER — Telehealth: Payer: Self-pay | Admitting: *Deleted

## 2023-05-03 NOTE — Telephone Encounter (Signed)
Pt stopped by office this morning. I spoke w/ him. He brought earnings statement from employee. I faxed this with household income size of 1 to [employer] at [phone]. Received fax confirmation. I marked urgent and informed them pt has been out of med for 2 days. Received fax confirmation.  Explained to patient that he may need to redo titration depending of how long he is off med for. He verbalized understanding.

## 2023-05-03 NOTE — Telephone Encounter (Signed)
Pt reported duloxetine 60mg  po every day makes him funny. I called pt back. Reported that Dr. Epimenio Foot had lowered his dose to 30mg  and called this in on 04/18/23. He was not aware. He will pick this up from pharmacy and try this lower dose. He will call back if he continues to have SE on lower dose.

## 2023-05-14 NOTE — Telephone Encounter (Signed)
Pt has been out of medication for 2 weeks. Calling for update on MAYZENT 1 MG TABS. Would like a call back.

## 2023-05-15 NOTE — Telephone Encounter (Addendum)
 Called [employer] patient assistance foundation at [phone]. Spoke w/ Crystal. They received paycheck stub faxed on 05/03/23. Gets paid weekly. Needing 12 consecutive paycheck stubs, not just the one. Mailed notice to pt. Said they faxed notice to our office but we did not receive.  I called pt. Relayed above. He will try to drop office additional paycheck stubs today or tomorrow for us  to fax in for him.

## 2023-05-17 NOTE — Telephone Encounter (Signed)
 Pt dropped off 15 consecutive paycheck stubs. I faxed these to [employer] and marked urgent at [phone]. Received fax confirmation.

## 2023-06-01 ENCOUNTER — Telehealth: Payer: Self-pay | Admitting: Neurology

## 2023-06-01 NOTE — Telephone Encounter (Signed)
Pt asking for Kara Mead, RN to call him with the status on his Mayzent 1 mg

## 2023-06-04 NOTE — Telephone Encounter (Signed)
 Returned call to pt who stated that he never got his mayzent or got approved. Was informed since has Nurse, learning disability will no longer get for free.  Pt been off of mayzent over a month and is worried ms will flare up. He stated that he will need titration. Pt stated that he hasn't spoken with [employer] but received a letter. Advised pt to contact [employer] [phone]. Pt voiced gratitude and understanding.

## 2023-06-08 NOTE — Telephone Encounter (Signed)
 Pt called wanting to inform the RN that [employer] and pt's assistance program turned him down. Please advise.

## 2023-06-08 NOTE — Telephone Encounter (Addendum)
 I tried completing urgent PA on covermymeds. Key: [id number]. Received the following response: "MAYZENT 1 MG TABLET is not covered for this Member. For formulary alternatives, please view the Member's corresponding formulary at https://www.myprime.com/en/forms.html or call us at [phone]."  [employer] Pt assistance foundation at [phone]. Was on hold 25 min and unable to speak with anyone.  Called [employer] Mayzent Support Program at [phone]. States program no longer available.  Covered options below per covermymeds:

## 2023-06-08 NOTE — Telephone Encounter (Signed)
 Spoke w/ Randall Butler. She called pt and he agreed to come in for appt to see Dr. Epimenio Foot to discuss alternate options 06/12/23 at 1pm. Pt scheduled.

## 2023-06-12 ENCOUNTER — Encounter: Payer: Self-pay | Admitting: Neurology

## 2023-06-12 ENCOUNTER — Ambulatory Visit: Payer: 59 | Admitting: Neurology

## 2023-06-12 VITALS — BP 133/95 | HR 66 | Ht 66.0 in | Wt 206.0 lb

## 2023-06-12 DIAGNOSIS — G35 Multiple sclerosis: Secondary | ICD-10-CM | POA: Diagnosis not present

## 2023-06-12 DIAGNOSIS — Z79899 Other long term (current) drug therapy: Secondary | ICD-10-CM | POA: Diagnosis not present

## 2023-06-12 DIAGNOSIS — R41840 Attention and concentration deficit: Secondary | ICD-10-CM | POA: Insufficient documentation

## 2023-06-12 DIAGNOSIS — R208 Other disturbances of skin sensation: Secondary | ICD-10-CM | POA: Diagnosis not present

## 2023-06-12 DIAGNOSIS — Z114 Encounter for screening for human immunodeficiency virus [HIV]: Secondary | ICD-10-CM | POA: Diagnosis not present

## 2023-06-12 NOTE — Progress Notes (Addendum)
 [facility]  PATIENT: Randall Butler DOB: 1980-10-07  REFERRING DOCTOR OR PCP: Randall Pastures, PA SOURCE: Patient, notes from primary care, notes from [facility] neurology, MRI and lab reports, MRI images personally reviewed.  _________________________________   HISTORICAL  CHIEF COMPLAINT:  Chief Complaint  Patient presents with   Follow-up    Rm10, alone, Discuss other MS DMT options-Mayzent not covered/unable to get and out of meds: pt stated feels better off mayzent    HISTORY OF PRESENT ILLNESS:  Randall Butler is a 43 y.o. man with relapsing remitting multiple sclerosis.  Update 06/12/2023: He was on Mayzent.  He had insurance He is tolerating it well.   No new neurologic symptoms since the last time I saw him.    No exacerbation.      His gait is good.    He has no trouble going down stairs..   he drives a truck and delivers.  HE has no difficulty currently but in heat feels more tired.  Strength is fine.   He feels a little dizzy looking up.     He has mild numbness in his toes but this is not painful.. Bladder  No incontinence.      Vision is fine.   He has reduced hearing and wears hearing aids.    Sometimes discriminating words can be more difficult.  Does not prevent him from working  He denies much fatigue.  He is more likely to get tired when working outdoors or going to a [employer] with wife.     He notes reduced STM compared to before the MS diagnosis.  He has trouble remembering numbers    He notes occasional issues with names.    He has reduced attention (has probable ADD since younger but was not hyperactive)  Denies depression has mild anxiety but unchanged.   He feels he sometimes says tings before he thinks and is a little more emotional.   Sleep is usually good but sometimes wakes up ad has trouble falling back asleep.  He snores some but has never been told he has pauses, snorts or gasps at night.    He will get very rare headaches  with migrainous aura and features.    He used to get nausea but now usually does not.   However, lights and noise bothers him and moving worsens the pain.  He takes ibuprofen or acetaminophen.   HA yesterday wa worse and he took a [place] powder.   He gets these just a couple times a yea and none in last 6 monthsr.      He takes Vit D (and B12, Mg)   MS HISTORY [name] was diagnosed with MS March 2022.   In 2014 he had numbness in his right arm and he had an MRI of the cervical spine and was told he had a spot in his spinal cord but MS was not mentioned and he had no follow up.   In November 2021, he had bilateral numbness in his feet and legs.   This improved, incompletely, over a few months.    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   He experienced numbness form the lower abdomen and down in March 2022 and had a tight sensation in the groin.   Symptom were worse in hot water.    He saw [facility] March 2022.    He had an MRi of the brain  and cervical spine which showed changes c/w MS.  He had 3 days of IV Solu-medrol and numbness improved but he continued to have a sensation in his feet like there is a balled up sock.   Imaging personally reviewed today: MRI brain 06/26/2020 shows T2/FLAIR hyperintense foci in the right pons near the dorsal root entry zone of the trigeminal nerve, left midbrain and in the periventricular, juxtacortical and deep white matter of both hemispheres.  One small focus in the juxtacortical white matter of the right parieto-occipital lobe had subtle enhancement.  MRI cervical spine 06/26/2020 showed T2 hyperintense foci just below the cervicomedullary junction, in the left posterolateral spinal cord adjacent to C1-C2, and the right posterolateral spinal cord adjacent to C5.  Mild degenerative changes at C5-C6 and C6-C7 not cord and spinal stenosis or nerve root compression.   REVIEW OF SYSTEMS: Constitutional: No  fevers, chills, sweats, or change in appetite Eyes: No visual changes, double vision, eye pain Ear, nose and throat: No hearing loss, ear pain, nasal congestion, sore throat Cardiovascular: No chest pain, palpitations Respiratory:  No shortness of breath at rest or with exertion.   No wheezes GastrointestinaI: No nausea, vomiting, diarrhea, abdominal pain, fecal incontinence Genitourinary:  No dysuria, urinary retention or frequency.  No nocturia. Musculoskeletal:  No neck pain, back pain Integumentary: No rash, pruritus, skin lesions Neurological: as above Psychiatric: No depression at this time.  No anxiety Endocrine: No palpitations, diaphoresis, change in appetite, change in weigh or increased thirst Hematologic/Lymphatic:  No anemia, purpura, petechiae. Allergic/Immunologic: No itchy/runny eyes, nasal congestion, recent allergic reactions, rashes  ALLERGIES: Allergies  Allergen Reactions   Penicillins Other (See Comments)    Per pt. His mother has always told him that he was allergic to PCN , but he is not sure of what type of reaction he had     HOME MEDICATIONS:  Current Outpatient Medications:    DULoxetine (CYMBALTA) 30 MG capsule, Take 1 capsule (30 mg total) by mouth daily., Disp: 30 capsule, Rfl: 11  PAST MEDICAL HISTORY: History reviewed. No pertinent past medical history.  PAST SURGICAL HISTORY: Past Surgical History:  Procedure Laterality Date   right inguinal hernia repair Right     FAMILY HISTORY: History reviewed. No pertinent family history.  SOCIAL HISTORY:  Social History   Socioeconomic History   Marital status: Single    Spouse name: Not on file   Number of children: 0   Years of education: Not on file   Highest education level: 12th grade  Occupational History   Not on file  Tobacco Use   Smoking status: Never    Passive exposure: Never   Smokeless tobacco: Never  Vaping Use   Vaping status: Never Used  Substance and Sexual Activity    Alcohol use: Yes    Alcohol/week: 3.0 standard drinks of alcohol    Types: 3 Standard drinks or equivalent per week    Comment: rare social   Drug use: Never   Sexual activity: Yes    Birth control/protection: Condom  Other Topics Concern   Not on file  Social History Narrative   Not on file   Social Drivers of Health   Financial Resource Strain: Not on file  Food Insecurity: Not on file  Transportation Needs: Not on file  Physical Activity: Not on file  Stress: Not on file  Social Connections: Unknown (08/21/2021)   [facility]   Social Network    Social Network: Not on  file  Intimate Partner Violence: Unknown (07/13/2021)   Received from [facility]   HITS    Physically Hurt: Not on file    Insult or Talk Down To: Not on file    Threaten Physical Harm: Not on file    Scream or Curse: Not on file     PHYSICAL EXAM  Vitals:   06/12/23 1303  BP: (!) 133/95  Pulse: 66  Weight: 206 lb (93.4 kg)  Height: 5\' 6"  (1.676 m)    Body mass index is 33.25 kg/m.   General: The patient is well-developed and well-nourished and in no acute distress  HEENT:  Head is [place]/AT.  Sclera are anicteric.  Skin: No rash or  edema.  Musculoskeletal:  Back is nontender  Neurologic Exam  Mental status: The patient is alert and oriented x 3 at the time of the examination. The patient has apparent normal recent and remote memory, with an apparently normal attention span and concentration ability.   Speech is normal.  Cranial nerves: Extraocular movements are full. Facial strength and sensation are normal  He has hearing aids  Motor:  Muscle bulk is normal.   Tone is normal. Strength is  5 / 5 in all 4 extremities.   Sensory: Sensory testing is intact to touch and vibration now.  Coordination: Cerebellar testing reveals good finger-nose-finger and heel-to-shin bilaterally.  Gait and station: Station is normal.   Gait is normal. Tandem is  now normal.  Romberg is negative.   Reflexes: Deep tendon reflexes are symmetric and normal bilaterally.        DIAGNOSTIC DATA (LABS, IMAGING, TESTING) - I reviewed patient records, labs, notes, testing and imaging myself where available.  Lab Results  Component Value Date   WBC 4.0 03/21/2023   HGB 17.3 03/21/2023   HCT 51.3 (H) 03/21/2023   MCV 87 03/21/2023   PLT 227 03/21/2023      Component Value Date/Time   NA 141 05/16/2022 1408   K 3.8 05/16/2022 1408   CL 102 05/16/2022 1408   CO2 25 05/16/2022 1408   GLUCOSE 89 05/16/2022 1408   GLUCOSE 138 (H) 09/12/2018 0838   BUN 13 05/16/2022 1408   CREATININE 0.91 05/16/2022 1408   CALCIUM 9.3 05/16/2022 1408   PROT 6.6 03/21/2023 0927   ALBUMIN 4.5 03/21/2023 0927   AST 15 03/21/2023 0927   ALT 29 03/21/2023 0927   ALKPHOS 64 03/21/2023 0927   BILITOT 0.7 03/21/2023 0927   GFRNONAA >60 09/12/2018 0838   GFRAA >60 09/12/2018 0838       ASSESSMENT AND PLAN  Multiple sclerosis (HCC) - Plan: Hepatitis B surface antigen, HIV Antibody (routine testing w rflx), IgG, IgA, IgM, QuantiFERON-TB Gold Plus, Hepatitis B core antibody, total, Hepatitis C antibody  Dysesthesia  High risk medication use - Plan: Hepatitis B surface antigen, HIV Antibody (routine testing w rflx), IgG, IgA, IgM, QuantiFERON-TB Gold Plus, Hepatitis B core antibody, total, Hepatitis C antibody  Encounter for screening for HIV - Plan: HIV Antibody (routine testing w rflx)  Attention deficit   He has relapsing remitting multiple sclerosis.  We discussed treatment options as having issues with Mayzent.  Due to his aggressive onset with spinal cord lesions and symptoms, we will change to an anti-CD20 agent and we discussed Briumvi or Ocrevus.    If ADD symptom worsen, we can consider a stimulant We discussed and continue to be active.   We discussed diet.    He will return  to see [place] in 6 months or sooner if there are new or worsening neurologic  symptoms.   Halen Antenucci A. Epimenio Foot, MD, [facility] 06/12/2023, 1:50 PM Certified in Neurology, Clinical Neurophysiology, Sleep Medicine and Neuroimaging  [facility] [address], [address] [place], [place] [address] [phone]

## 2023-06-16 LAB — HIV ANTIBODY (ROUTINE TESTING W REFLEX): HIV Screen 4th Generation wRfx: NONREACTIVE

## 2023-06-16 LAB — IGG, IGA, IGM
IgA/Immunoglobulin A, Serum: 141 mg/dL (ref 90–386)
IgG (Immunoglobin G), Serum: 947 mg/dL (ref 603–1613)
IgM (Immunoglobulin M), Srm: 41 mg/dL (ref 20–172)

## 2023-06-16 LAB — QUANTIFERON-TB GOLD PLUS
QuantiFERON Mitogen Value: >10 [IU]/mL
QuantiFERON Nil Value: 0.01 [IU]/mL
QuantiFERON TB1 Ag Value: 0.01 [IU]/mL
QuantiFERON TB2 Ag Value: 0.01 [IU]/mL
QuantiFERON-TB Gold Plus: NEGATIVE

## 2023-06-16 LAB — HEPATITIS B CORE ANTIBODY, TOTAL: Hep B Core Total Ab: NEGATIVE

## 2023-06-16 LAB — HEPATITIS B SURFACE ANTIGEN: Hepatitis B Surface Ag: NEGATIVE

## 2023-06-16 LAB — HEPATITIS C ANTIBODY: Hep C Virus Ab: NONREACTIVE

## 2023-06-18 ENCOUNTER — Telehealth: Payer: Self-pay | Admitting: *Deleted

## 2023-06-18 NOTE — Telephone Encounter (Signed)
Faxed completed/signed Briumvi start form to [facility]riumvi Patient Support at [phone]. Received fax confirmation.

## 2023-06-18 NOTE — Telephone Encounter (Signed)
-----   Message from Asa Lente sent at 06/16/2023  1:26 PM EST ----- The lab work looks good.  We can go ahead and get him started on Briumvi (Ocrevus as a backup)

## 2023-06-18 NOTE — Telephone Encounter (Signed)
 Called and spoke w/ pt about results. Pt verbalized understanding.    Aware we will send in [facility] form. He will get call from Briumvi pt support to verify info. We will also give orders to [facility] to start working on approval process prior to scheduling him.

## 2023-06-19 NOTE — Telephone Encounter (Signed)
 Gave copy below to [facility]:

## 2023-07-10 NOTE — Telephone Encounter (Signed)
 Per Holly/[facility]:   Scheduled for first day Briumvi infusion 07/11/23 and day 15 08/14/23.

## 2023-08-08 NOTE — Telephone Encounter (Signed)
 Patient's first Briumvi infusion was 07/30/2023.

## 2023-10-03 ENCOUNTER — Ambulatory Visit: Payer: 59 | Admitting: Neurology

## 2023-12-11 ENCOUNTER — Encounter: Payer: Self-pay | Admitting: Neurology

## 2023-12-11 ENCOUNTER — Ambulatory Visit: Admitting: Neurology

## 2023-12-11 VITALS — BP 123/86 | HR 80 | Ht 66.0 in | Wt 218.8 lb

## 2023-12-11 DIAGNOSIS — Z79899 Other long term (current) drug therapy: Secondary | ICD-10-CM | POA: Diagnosis not present

## 2023-12-11 DIAGNOSIS — R208 Other disturbances of skin sensation: Secondary | ICD-10-CM

## 2023-12-11 DIAGNOSIS — G35 Multiple sclerosis: Secondary | ICD-10-CM

## 2023-12-11 DIAGNOSIS — E559 Vitamin D deficiency, unspecified: Secondary | ICD-10-CM | POA: Diagnosis not present

## 2023-12-11 DIAGNOSIS — R41840 Attention and concentration deficit: Secondary | ICD-10-CM

## 2023-12-11 MED ORDER — PHENTERMINE HCL 37.5 MG PO CAPS: 37.5000 mg | ORAL_CAPSULE | ORAL | 5 refills | Status: AC

## 2023-12-11 MED ORDER — VITAMIN D (ERGOCALCIFEROL) 1.25 MG (50000 UNIT) PO CAPS: 50000.0000 [IU] | ORAL_CAPSULE | ORAL | 1 refills | Status: AC

## 2023-12-11 NOTE — Progress Notes (Signed)
 [facility]  PATIENT: Randall Butler DOB: May 22, 1980  REFERRING DOCTOR OR PCP: Elsie Brought, PA SOURCE: Patient, notes from primary care, notes from [facility] neurology, MRI and lab reports, MRI images personally reviewed.  _________________________________   HISTORICAL  CHIEF COMPLAINT:  Chief Complaint  Patient presents with   Follow-up    Pt in room 11. Alone. Here for MS follow up. DMT: Briumvi Last infusion date: 08/14/2023 Next infusion date: 01/14/2024. Patient reports doing well, no concerns. No falls, last eye exam was 1 month ago.     HISTORY OF PRESENT ILLNESS:  Randall Butler is a 43 y.o. man with relapsing remitting multiple sclerosis.  Update 12/11/2023 He was on Mayzent  but switched to Briumvi 6 months ago.   No exacerbations.   His gait is good.    He has no trouble going down stairs..   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  HE has no difficulty currently but in heat feels more tired.  Strength is fine.  No longer has dizziness     He still has mild numbness in his toes but this is not painful.. Bladder  No incontinence.      Vision is fine.   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 .    Sometimes discriminating words can be more difficult but does not interfere with working.  He has urinary hesitancy that is mild.  W ediscussed tamsulosin if it worsens.   He has mild fatigue.  He is more likely to get tired when working outdoors or going to a [employer] with wife.     He notes reduced STM compared to before the MS diagnosis.  He has trouble remembering numbers    He notes occasional issues with names.    He has reduced attention (has probable ADD since younger but was not hyperactive).    Denies depression has mild anxiety but unchanged.   Sleep is usually good but sometimes wakes up ad has trouble falling back asleep.  He snores some but has never been told he has  pauses, snorts or gasps at night.    No recent headache.  He was having very rare headaches with migrainous aura and features.    He used to get nausea but now usually does not.   However, lights and noise bothers him and moving worsens the pain.  He takes ibuprofen  or acetaminophen .   HA yesterday wa worse and he took a [place] powder.  Last one a year ago or so.     He takes Vit D 5000 U daily (and B12, Mg)   MS HISTORY Randall Butler was diagnosed with MS March 2022.   In 2014 he had numbness in his right arm and he had an MRI of the cervical spine and was told he had a spot in his spinal cord but MS was not mentioned and he had no follow up.   In November 2021, he had bilateral numbness in his feet and legs.   This improved, incompletely, over a few months.    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   He experienced numbness form the lower abdomen and down in March 2022 and had a tight sensation in the groin.   Symptom were worse in hot water.    He saw [facility] March 2022.    He had an  MRi of the brain and cervical spine which showed changes c/w MS.  He had 3 days of IV Solu-medrol  and numbness improved but he continued to have a sensation in his feet like there is a balled up sock.   Imaging personally reviewed today: MRI brain 06/26/2020 shows T2/FLAIR hyperintense foci in the right pons near the dorsal root entry zone of the trigeminal nerve, left midbrain and in the periventricular, juxtacortical and deep white matter of both hemispheres.  One small focus in the juxtacortical white matter of the right parieto-occipital lobe had subtle enhancement.  MRI cervical spine 06/26/2020 showed T2 hyperintense foci just below the cervicomedullary junction, in the left posterolateral spinal cord adjacent to C1-C2, and the right posterolateral spinal cord adjacent to C5.  Mild degenerative changes at C5-C6 and C6-C7 not cord and spinal stenosis or nerve  root compression.   REVIEW OF SYSTEMS: Constitutional: No fevers, chills, sweats, or change in appetite Eyes: No visual changes, double vision, eye pain Ear, nose and throat: No hearing loss, ear pain, nasal congestion, sore throat Cardiovascular: No chest pain, palpitations Respiratory:  No shortness of breath at rest or with exertion.   No wheezes GastrointestinaI: No nausea, vomiting, diarrhea, abdominal pain, fecal incontinence Genitourinary:  No dysuria, urinary retention or frequency.  No nocturia. Musculoskeletal:  No neck pain, back pain Integumentary: No rash, pruritus, skin lesions Neurological: as above Psychiatric: No depression at this time.  No anxiety Endocrine: No palpitations, diaphoresis, change in appetite, change in weigh or increased thirst Hematologic/Lymphatic:  No anemia, purpura, petechiae. Allergic/Immunologic: No itchy/runny eyes, nasal congestion, recent allergic reactions, rashes  ALLERGIES: Allergies  Allergen Reactions   Penicillins Other (See Comments)    Per pt. His mother has always told him that he was allergic to PCN , but he is not sure of what type of reaction he had     HOME MEDICATIONS:  Current Outpatient Medications:    Vitamin D , Ergocalciferol , (DRISDOL ) 1.25 MG (50000 UNIT) CAPS capsule, Take 1 capsule (50,000 Units total) by mouth every 7 (seven) days., Disp: 13 capsule, Rfl: 1   DULoxetine  (CYMBALTA ) 30 MG capsule, Take 1 capsule (30 mg total) by mouth daily. (Patient not taking: Reported on 12/11/2023), Disp: 30 capsule, Rfl: 11  PAST MEDICAL HISTORY: History reviewed. No pertinent past medical history.  PAST SURGICAL HISTORY: Past Surgical History:  Procedure Laterality Date   right inguinal hernia repair Right     FAMILY HISTORY: History reviewed. No pertinent family history.  SOCIAL HISTORY:  Social History   Socioeconomic History   Marital status: Single    Spouse name: Not on file   Number of children: 0   Years of  education: Not on file   Highest education level: 12th grade  Occupational History   Not on file  Tobacco Use   Smoking status: Never    Passive exposure: Never   Smokeless tobacco: Never  Vaping Use   Vaping status: Never Used  Substance and Sexual Activity   Alcohol use: Yes    Alcohol/week: 3.0 standard drinks of alcohol    Types: 3 Standard drinks or equivalent per week    Comment: rare social   Drug use: Never   Sexual activity: Yes    Birth control/protection: Condom  Other Topics Concern   Not on file  Social History Narrative   Not on file   Social Drivers of Health   Financial Resource Strain: Not on file  Food Insecurity: Not on file  Transportation  Needs: Not on file  Physical Activity: Not on file  Stress: Not on file  Social Connections: Unknown (08/21/2021)   Received from [facility]   Social Network    Social Network: Not on file  Intimate Partner Violence: Unknown (07/13/2021)   Received from [facility]   HITS    Physically Hurt: Not on file    Insult or Talk Down To: Not on file    Threaten Physical Harm: Not on file    Scream or Curse: Not on file     PHYSICAL EXAM  Vitals:   12/11/23 0854  BP: 123/86  Pulse: 80  Weight: 218 lb 12.8 oz (99.2 kg)  Height: 5' 6 (1.676 m)    Body mass index is 35.32 kg/m.   General: The patient is well-developed and well-nourished and in no acute distress  HEENT:  Head is [place]/AT.  Sclera are anicteric.  Skin: No rash or  edema.  Musculoskeletal:  Back is nontender  Neurologic Exam  Mental status: The patient is alert and oriented x 3 at the time of the examination. The patient has apparent normal recent and remote memory, with an apparently normal attention span and concentration ability.   Speech is normal.  Cranial nerves: Extraocular movements are full. Facial strength and sensation are normal  He has hearing aids  Motor:  Muscle bulk is normal.   Tone is normal. Strength is  5 / 5 in all 4  extremities.   Sensory: Sensory testing is intact to touch and vibration now.  Coordination: Cerebellar testing reveals good finger-nose-finger and heel-to-shin bilaterally.  Gait and station: Station is normal.  Gait and tandem gait were normal today.  Romberg is negative.   Reflexes: Deep tendon reflexes are symmetric and normal bilaterally.        DIAGNOSTIC DATA (LABS, IMAGING, TESTING) - I reviewed patient records, labs, notes, testing and imaging myself where available.  Lab Results  Component Value Date   WBC 4.0 03/21/2023   HGB 17.3 03/21/2023   HCT 51.3 (H) 03/21/2023   MCV 87 03/21/2023   PLT 227 03/21/2023      Component Value Date/Time   NA 141 05/16/2022 1408   K 3.8 05/16/2022 1408   CL 102 05/16/2022 1408   CO2 25 05/16/2022 1408   GLUCOSE 89 05/16/2022 1408   GLUCOSE 138 (H) 09/12/2018 0838   BUN 13 05/16/2022 1408   CREATININE 0.91 05/16/2022 1408   CALCIUM 9.3 05/16/2022 1408   PROT 6.6 03/21/2023 0927   ALBUMIN 4.5 03/21/2023 0927   AST 15 03/21/2023 0927   ALT 29 03/21/2023 0927   ALKPHOS 64 03/21/2023 0927   BILITOT 0.7 03/21/2023 0927   GFRNONAA >60 09/12/2018 0838   GFRAA >60 09/12/2018 0838       ASSESSMENT AND PLAN  Multiple sclerosis (HCC) - Plan: IgG, IgA, IgM, CBC with Differential/Platelet  Dysesthesia - Plan: IgG, IgA, IgM, CBC with Differential/Platelet  High risk medication use  Vitamin D  deficiency   Continue Briumvi.  We will check IgG/IgM and CBC/differential.  Will try to touch the next visit right before his infusion and check CD20 at that time.  Additionally consider an MRI of the brain around that time.    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 We discussed and continue to be active.   We discussed diet.    Continue Vit D. He will return to see us  in 6 months  or sooner if there are new or worsening neurologic symptoms.   Randall Butler A. Vear, MD, [facility] 12/11/2023,  9:25 AM Certified in Neurology, Clinical Neurophysiology, Sleep Medicine and Neuroimaging  [facility] [address] [place], [place] [address] [phone]

## 2023-12-12 ENCOUNTER — Ambulatory Visit: Payer: Self-pay | Admitting: Neurology

## 2023-12-12 LAB — CBC WITH DIFFERENTIAL/PLATELET
Basophils Absolute: 0 x10E3/uL (ref 0.0–0.2)
Basos: 1 %
EOS (ABSOLUTE): 0.1 x10E3/uL (ref 0.0–0.4)
Eos: 3 %
Hematocrit: 51.3 % — ABNORMAL HIGH (ref 37.5–51.0)
Hemoglobin: 16.4 g/dL (ref 13.0–17.7)
Immature Grans (Abs): 0 x10E3/uL (ref 0.0–0.1)
Immature Granulocytes: 0 %
Lymphocytes Absolute: 0.9 x10E3/uL (ref 0.7–3.1)
Lymphs: 18 %
MCH: 28.9 pg (ref 26.6–33.0)
MCHC: 32 g/dL (ref 31.5–35.7)
MCV: 90 fL (ref 79–97)
Monocytes Absolute: 0.6 x10E3/uL (ref 0.1–0.9)
Monocytes: 12 %
Neutrophils Absolute: 3.2 x10E3/uL (ref 1.4–7.0)
Neutrophils: 66 %
Platelets: 249 x10E3/uL (ref 150–450)
RBC: 5.68 x10E6/uL (ref 4.14–5.80)
RDW: 13.1 % (ref 11.6–15.4)
WBC: 4.8 x10E3/uL (ref 3.4–10.8)

## 2023-12-12 LAB — IGG, IGA, IGM
IgA/Immunoglobulin A, Serum: 175 mg/dL (ref 90–386)
IgG (Immunoglobin G), Serum: 984 mg/dL (ref 603–1613)
IgM (Immunoglobulin M), Srm: 44 mg/dL (ref 20–172)

## 2023-12-25 ENCOUNTER — Ambulatory Visit: Admitting: Neurology

## 2024-04-17 ENCOUNTER — Telehealth: Payer: Self-pay | Admitting: Neurology

## 2024-04-17 NOTE — Telephone Encounter (Signed)
 LVM and sent Mychart informing pt of reschedule needed for 3/23

## 2024-06-30 ENCOUNTER — Ambulatory Visit: Admitting: Neurology
